# Patient Record
Sex: Female | Born: 1985 | Race: White | Hispanic: No | Marital: Married | Smoking: Never smoker
Health system: Southern US, Community
[De-identification: ages and names within clinical notes are randomized; demographics above are authoritative.]

## PROBLEM LIST (undated history)

## (undated) ENCOUNTER — Inpatient Hospital Stay (HOSPITAL_COMMUNITY): Payer: Self-pay

## (undated) DIAGNOSIS — E559 Vitamin D deficiency, unspecified: Secondary | ICD-10-CM

## (undated) HISTORY — PX: APPENDECTOMY: SHX54

## (undated) HISTORY — DX: Vitamin D deficiency, unspecified: E55.9

---

## 2016-05-13 ENCOUNTER — Ambulatory Visit (HOSPITAL_COMMUNITY)
Admission: EM | Admit: 2016-05-13 | Discharge: 2016-05-13 | Disposition: A | Discharge status: Home / Self Care | Payer: Self-pay | Attending: Family Medicine | Admitting: Family Medicine

## 2016-05-13 ENCOUNTER — Encounter (HOSPITAL_COMMUNITY): Payer: Self-pay | Admitting: Emergency Medicine

## 2016-05-13 DIAGNOSIS — Z9109 Other allergy status, other than to drugs and biological substances: Secondary | ICD-10-CM

## 2016-05-13 DIAGNOSIS — R220 Localized swelling, mass and lump, head: Secondary | ICD-10-CM

## 2016-05-13 DIAGNOSIS — T7840XA Allergy, unspecified, initial encounter: Secondary | ICD-10-CM

## 2016-05-13 DIAGNOSIS — Z91048 Other nonmedicinal substance allergy status: Secondary | ICD-10-CM

## 2016-05-13 MED ORDER — DIPHENHYDRAMINE HCL 25 MG PO CAPS: 25.0000 mg | ORAL_CAPSULE | Freq: Four times a day (QID) | ORAL | 0 refills | Status: DC | PRN

## 2016-05-13 MED ORDER — METHYLPREDNISOLONE 4 MG PO TBPK: ORAL_TABLET | ORAL | 0 refills | Status: DC

## 2016-05-13 NOTE — ED Notes (Signed)
Patient referred to ucc by [place] congregational nurse

## 2016-05-13 NOTE — ED Notes (Signed)
Patient denies pain and is resting comfortably.  

## 2016-05-13 NOTE — ED Triage Notes (Signed)
Patient has a swollen upper lip.  Reports this occurs once a week for two months now.  Lip is itchy.

## 2016-05-13 NOTE — ED Provider Notes (Signed)
CSN: [id number]     Arrival date & time 05/13/16  1103 History   None    No chief complaint on file.  (Consider location/radiation/quality/duration/timing/severity/associated sxs/prior Treatment) She has been having upper lip swelling on and off for last 2 months.  She is unaware of having any specific allergies.  She denies tongue swelling or swelling in her throat.  She did have some left eye peri- orbital swelling and eye itching but it has resolved.    Allergic Reaction  Presenting symptoms: itching and swelling   Severity:  Mild Duration:  2 days Prior allergic episodes:  Unable to specify Relieved by:  None tried Worsened by:  Nothing Ineffective treatments:  None tried   History reviewed. No pertinent past medical history. History reviewed. No pertinent surgical history. No family history on file. Social History  Substance Use Topics  . Smoking status: Never Smoker  . Smokeless tobacco: Never Used  . Alcohol use No   OB History    No data available     Review of Systems  Constitutional: Negative.   HENT: Positive for facial swelling.   Eyes: Negative.   Respiratory: Negative.   Cardiovascular: Negative.   Gastrointestinal: Negative.   Endocrine: Negative.   Genitourinary: Negative.   Musculoskeletal: Negative.   Skin: Positive for itching.  Allergic/Immunologic: Negative.   Neurological: Negative.   Hematological: Negative.   Psychiatric/Behavioral: Negative.     Allergies  Review of patient's allergies indicates not on file.  Home Medications   Prior to Admission medications   Not on File   Meds Ordered and Administered this Visit  Medications - No data to display  BP (!) 88/65 (BP Location: Left Arm)   Pulse 75   Temp 97.6 F (36.4 C) (Oral)   Resp 14   SpO2 97%  No data found.   Physical Exam  Constitutional: She appears well-developed and well-nourished.  HENT:  Head: Normocephalic and atraumatic.  Right Ear: External ear normal.   Left Ear: External ear normal.  Nose: Nose normal.  Mouth/Throat: Oropharynx is clear and moist.  Upper lip mildly swollen  Eyes: Conjunctivae and EOM are normal. Pupils are equal, round, and reactive to light.  Neck: Normal range of motion. Neck supple.  Cardiovascular: Normal rate, regular rhythm and normal heart sounds.   Pulmonary/Chest: Effort normal and breath sounds normal.  Abdominal: Soft. Bowel sounds are normal.  Nursing note and vitals reviewed.   Urgent Care Course   Clinical Course    Procedures (including critical care time)  Labs Review Labs Reviewed - No data to display  Imaging Review No results found.   Visual Acuity Review  Right Eye Distance:   Left Eye Distance:   Bilateral Distance:    Right Eye Near:   Left Eye Near:    Bilateral Near:         MDM  Allergies Upper lip swelling  Medrol dose pack as directed Diphenhydramine 25mg  one po q 8 hours prn #30  Follow up prn   Deatra CanterWilliam J Srikar Chiang, FNP 05/13/16 1441

## 2016-05-13 NOTE — Congregational Nurse Program (Signed)
Congregational Nurse Program Note  Date of Encounter: 05/13/2016  Past Medical History: No past medical history on file.  Encounter Details:     CNP Questionnaire - 05/13/16 1101      Patient Demographics   Is this a new or existing patient? New   Patient is considered a/an Immigrant   Race Asian     Patient Assistance   Location of Patient Assistance Not Applicable   Patient's financial/insurance status Self-Pay   Uninsured Patient Yes   Interventions Referred to ED/Urgent Care   Patient referred to apply for the following financial assistance [employer] Connects   Food insecurities addressed Not Applicable   Transportation assistance No   Assistance securing medications No   Educational Programmer, systemshealth offerings Navigating the healthcare system     Encounter Details   Primary purpose of visit Acute Illness/Condition Visit   Was an Emergency Department visit averted? Yes   Does patient have a medical provider? No   Patient referred to Urgent Care   Was a mental health screening completed? (GAINS tool) No   Does patient have dental issues? No   Does patient have vision issues? No   Does your patient have an abnormal blood pressure today? Yes   Since previous encounter, have you referred patient for abnormal blood pressure that resulted in a new diagnosis or medication change? No   Does your patient have an abnormal blood glucose today? No   Since previous encounter, have you referred patient for abnormal blood glucose that resulted in a new diagnosis or medication change? No   Was there a life-saving intervention made? No     Initial visit to office for this lady from [place] since one month. Arabic speaking, but communicated in [place] without Nurse, learning disabilitytranslator. Accompanied by family member with concern about swollen upper lip reoccurring about 2 months. Upper lip swollen outside and inside with blisters and itching.  Reddened rash of right wrist and anterior left thigh also. Problem  reocurring since 2 months and unrelated to any identified food or exposure to insects. Last food eaten within 24 hours included almonds. [facility]. Counseled on application for insurance coverage; referred to agency social worker. Return to nurse on 08-09. [facility] Leslie Liptak,RN/CN

## 2016-10-06 NOTE — L&D Delivery Note (Signed)
Delivery Note At 11:07 PM a viable female was delivered via VBAC, Spontaneous (Presentation:OA ).  APGAR: 9, 9; weight pending. Infant placed on maternal abdomen; stimulated. Cord clamped and cut after 1 min.  Placenta status: intact.  Cord: 1 UA.   Anesthesia:  epidural Episiotomy: None Lacerations:  1st degree perineal repair; hemostatic. Bilateral periurethral superficial tears, hemostatic.  Suture Repair: NA Est. Blood Loss (mL):  200  Mom to postpartum.  Baby to Couplet care / Skin to Skin.  Charlesetta Garibaldi Merric Yost CNM 05/23/2017, 11:24 PM

## 2016-11-12 NOTE — Congregational Nurse Program (Signed)
Congregational Nurse Program Note  Date of Encounter: 11/12/2016  Past Medical History: No past medical history on file.  Encounter Details:     CNP Questionnaire - 11/12/16 1045      Patient Demographics   Is this a new or existing patient? Existing   Patient is considered a/an Immigrant   Race Asian     Patient Assistance   Location of Patient Assistance Not Applicable   Patient's financial/insurance status Medicaid   Uninsured Patient ([employer]/Care Connects) No   Patient referred to apply for the following financial assistance Not Applicable   Food insecurities addressed Not Applicable   Transportation assistance No   Assistance securing medications No   Educational Programmer, systemshealth offerings Navigating the healthcare system     Encounter Details   Primary purpose of visit Navigating the Healthcare System   Was an Emergency Department visit averted? Not Applicable   Does patient have a medical provider? No   Patient referred to Other   Was a mental health screening completed? (GAINS tool) No   Does patient have dental issues? No   Does patient have vision issues? No   Does your patient have an abnormal blood pressure today? Yes   Since previous encounter, have you referred patient for abnormal blood pressure that resulted in a new diagnosis or medication change? No   Does your patient have an abnormal blood glucose today? No   Since previous encounter, have you referred patient for abnormal blood glucose that resulted in a new diagnosis or medication change? No   Was there a life-saving intervention made? No     Brief [facility] at [facility]  to request assistance in locating Obstetrical care. Speaks Arabic and limited [place]. Specified a female providerx due to Islamic beliefs and practices. Ineligible for Women's High Risk clinic and attempting to locate providers accepting Medicaid. [facility] at 8 weeks.  Expected  date of delivery 06-09-17. [facility], [address], [address] Appointment date pending; office will call client in 1 week for appointment at [phone]. Return prn. Ferol LuzMarietta Dwana Garin, RN/CN

## 2016-12-09 ENCOUNTER — Ambulatory Visit (INDEPENDENT_AMBULATORY_CARE_PROVIDER_SITE_OTHER): Payer: Medicaid Other | Admitting: Certified Nurse Midwife

## 2016-12-09 ENCOUNTER — Encounter: Payer: Self-pay | Admitting: Certified Nurse Midwife

## 2016-12-09 ENCOUNTER — Other Ambulatory Visit (HOSPITAL_COMMUNITY)
Admission: RE | Admit: 2016-12-09 | Discharge: 2016-12-09 | Disposition: A | Discharge status: Home / Self Care | Payer: Medicaid Other | Source: Ambulatory Visit | Attending: Certified Nurse Midwife | Admitting: Certified Nurse Midwife

## 2016-12-09 VITALS — BP 91/61 | HR 96 | Ht 64.0 in | Wt 112.6 lb

## 2016-12-09 DIAGNOSIS — Z3480 Encounter for supervision of other normal pregnancy, unspecified trimester: Secondary | ICD-10-CM | POA: Insufficient documentation

## 2016-12-09 DIAGNOSIS — Z3401 Encounter for supervision of normal first pregnancy, first trimester: Secondary | ICD-10-CM

## 2016-12-09 DIAGNOSIS — Z3689 Encounter for other specified antenatal screening: Secondary | ICD-10-CM

## 2016-12-09 DIAGNOSIS — B3731 Acute candidiasis of vulva and vagina: Secondary | ICD-10-CM

## 2016-12-09 DIAGNOSIS — O98811 Other maternal infectious and parasitic diseases complicating pregnancy, first trimester: Secondary | ICD-10-CM

## 2016-12-09 DIAGNOSIS — O34219 Maternal care for unspecified type scar from previous cesarean delivery: Secondary | ICD-10-CM

## 2016-12-09 DIAGNOSIS — B373 Candidiasis of vulva and vagina: Secondary | ICD-10-CM

## 2016-12-09 DIAGNOSIS — O219 Vomiting of pregnancy, unspecified: Secondary | ICD-10-CM

## 2016-12-09 DIAGNOSIS — Z3687 Encounter for antenatal screening for uncertain dates: Secondary | ICD-10-CM

## 2016-12-09 MED ORDER — PROVIDA DHA 16-16-1.25-110 MG PO CAPS: 1.0000 | ORAL_CAPSULE | Freq: Every day | ORAL | 12 refills | Status: DC

## 2016-12-09 MED ORDER — ONDANSETRON HCL 8 MG PO TABS: 8.0000 mg | ORAL_TABLET | Freq: Three times a day (TID) | ORAL | 2 refills | Status: DC | PRN

## 2016-12-09 MED ORDER — DOXYLAMINE-PYRIDOXINE 10-10 MG PO TBEC: DELAYED_RELEASE_TABLET | ORAL | 4 refills | Status: DC

## 2016-12-09 MED ORDER — TERCONAZOLE 0.8 % VA CREA
1.0000 | TOPICAL_CREAM | Freq: Every day | VAGINAL | 0 refills | Status: DC
Start: 2016-12-09 — End: 2017-01-07

## 2016-12-09 MED ORDER — FLUCONAZOLE 150 MG PO TABS: 150.0000 mg | ORAL_TABLET | Freq: Once | ORAL | 0 refills | Status: AC

## 2016-12-09 NOTE — Progress Notes (Signed)
Patient reports some lite cramps.Patient does have discharge.

## 2016-12-09 NOTE — Addendum Note (Signed)
Addended by: Samantha CrimesENNEY, RACHELLE ANNE on: 12/09/2016 05:49 PM   Modules accepted: Level of Service

## 2016-12-09 NOTE — Progress Notes (Signed)
Subjective:    Leslie Shaw is being seen today for her first obstetrical visit.  This is a planned pregnancy. She is at 1273w0d gestation. Her obstetrical history is significant for previous C-section for breech in [place] at 34 weeks PPROM, had previous vaginal delivery in [place]. Relationship with FOB: spouse, living together. Patient does intend to breast feed. Pregnancy history fully reviewed.  The information documented in the HPI was reviewed and verified.  Menstrual History: OB History    Gravida Para Term Preterm AB Living   3 2 1 1   2    SAB TAB Ectopic Multiple Live Births           2      Obstetric Comments   Patient had twin pregnancy- she had C-section delivery at 34 weeks- emergent delivery water broke- week 11 they knew of 1 twin demise       Patient's last menstrual period was 09/02/2016 (exact date).    History reviewed. No pertinent past medical history.  Past Surgical History:  Procedure Laterality Date  . APPENDECTOMY    . CESAREAN SECTION       (Not in a hospital admission) Allergies  Allergen Reactions  . Other Swelling    Tree nuts- pine cone    Social History  Substance Use Topics  . Smoking status: Never Smoker  . Smokeless tobacco: Never Used  . Alcohol use No    History reviewed. No pertinent family history.   Review of Systems Constitutional: negative for weight loss Gastrointestinal: + for vomiting Genitourinary:negative for genital lesions and vaginal discharge and dysuria Musculoskeletal:negative for back pain Behavioral/Psych: negative for abusive relationship, depression, illegal drug usage and tobacco use    Objective:    BP 91/61   Pulse 96   Ht 5\' 4"  (1.626 m)   Wt 112 lb 9.6 oz (51.1 kg)   LMP 09/02/2016 (Exact Date)   BMI 19.33 kg/m  General Appearance:    Alert, cooperative, no distress, appears stated age  Head:    Normocephalic, without obvious abnormality, atraumatic  Eyes:    PERRL, conjunctiva/corneas clear, EOM's  intact, fundi    benign, both eyes  Ears:    Normal TM's and external ear canals, both ears  Nose:   Nares normal, septum midline, mucosa normal, no drainage    or sinus tenderness  Throat:   Lips, mucosa, and tongue normal; teeth and gums normal  Neck:   Supple, symmetrical, trachea midline, no adenopathy;    thyroid:  no enlargement/tenderness/nodules; no carotid   bruit or JVD  Back:     Symmetric, no curvature, ROM normal, no CVA tenderness  Lungs:     Clear to auscultation bilaterally, respirations unlabored  Chest Wall:    No tenderness or deformity   Heart:    Regular rate and rhythm, S1 and S2 normal, no murmur, rub   or gallop  Breast Exam:    No tenderness, masses, or nipple abnormality  Abdomen:     Soft, non-tender, bowel sounds active all four quadrants,    no masses, no organomegaly  Genitalia:    Normal female without lesion, discharge or tenderness  Extremities:   Extremities normal, atraumatic, no cyanosis or edema  Pulses:   2+ and symmetric all extremities  Skin:   Skin color, texture, turgor normal, no rashes or lesions  Lymph nodes:   Cervical, supraclavicular, and axillary nodes normal  Neurologic:   CNII-XII intact, normal strength, sensation and reflexes  throughout        Cervix: long, thick, closed and posterior.  FHR: 158 by doppler.  FH: less than U.    Lab Review Urine pregnancy test Labs reviewed no Radiologic studies reviewed no Assessment:    Pregnancy at [redacted]w[redacted]d weeks     Supervision of other normal pregnancy, antepartum - Plan: Cervicovaginal ancillary only, TSH, Varicella zoster antibody, IgG, Hemoglobinopathy evaluation, Culture, OB Urine, MaterniT21 PLUS Core+SCA, Hemoglobin A1c, Obstetric Panel, Including HIV, Cystic Fibrosis Mutation 97, US OB Comp + 14 Wk, Prenat-FeFum-FePo-FA-DHA w/o A (PROVIDA DHA) 16-16-1.25-110 MG CAPS, Rheumatoid Arthritis Profile, US OB Comp Less 14 Wks, US OB Transvaginal, [place] MFM OB COMP + 14 WK, CANCELED: Cytology -  PAP  Unsure of LMP (last menstrual period) as reason for ultrasound scan - Plan: US OB Comp Less 14 Wks, US OB Transvaginal  Previous cesarean delivery, delivered  Nausea and vomiting in pregnancy prior to [redacted] weeks gestation - Plan: ondansetron (ZOFRAN) 8 MG tablet, Doxylamine-Pyridoxine (DICLEGIS) 10-10 MG TBEC  Yeast infection of the vagina - Plan: fluconazole (DIFLUCAN) 150 MG tablet, terconazole (TERAZOL 3) 0.8 % vaginal cream  Encounter for fetal anatomic survey - Plan: [place] MFM OB COMP + 14 WK   Plan:     Medical records requested from previous C-section.  Prenatal vitamins.  Counseling provided regarding continued use of seat belts, cessation of alcohol consumption, smoking or use of illicit drugs; infection precautions i.e., influenza/TDAP immunizations, toxoplasmosis,CMV, parvovirus, listeria and varicella; workplace safety, exercise during pregnancy; routine dental care, safe medications, sexual activity, hot tubs, saunas, pools, travel, caffeine use, fish and methlymercury, potential toxins, hair treatments, varicose veins Weight gain recommendations per IOM guidelines reviewed: underweight/BMI< 18.5--> gain 28 - 40 lbs; normal weight/BMI 18.5 - 24.9--> gain 25 - 35 lbs; overweight/BMI 25 - 29.9--> gain 15 - 25 lbs; obese/BMI >30->gain  11 - 20 lbs Problem list reviewed and updated. FIRST/CF mutation testing/NIPT/QUAD SCREEN/fragile X/Ashkenazi Jewish population testing/Spinal muscular atrophy discussed: ordered. Role of ultrasound in pregnancy discussed; fetal survey: ordered. Amniocentesis discussed: not indicated. VBAC calculator score: VBAC consent form provided Meds ordered this encounter  Medications  . prenatal vitamin w/FE, FA (PRENATAL 1 + 1) 27-1 MG TABS tablet    Sig: Take 1 tablet by mouth daily at 12 noon.  . pyridoxine (B-6) 100 MG tablet    Sig: Take 100 mg by mouth daily.  . colchicine 0.6 MG tablet    Sig: Take 0.6 mg by mouth daily. 1 mg  .  Prenat-FeFum-FePo-FA-DHA w/o A (PROVIDA DHA) 16-16-1.25-110 MG CAPS    Sig: Take 1 tablet by mouth at bedtime.    Dispense:  30 capsule    Refill:  12  . ondansetron (ZOFRAN) 8 MG tablet    Sig: Take 1 tablet (8 mg total) by mouth every 8 (eight) hours as needed for nausea or vomiting.    Dispense:  40 tablet    Refill:  2  . Doxylamine-Pyridoxine (DICLEGIS) 10-10 MG TBEC    Sig: Take 1 tablet with breakfast and lunch.  Take 2 tablets at bedtime.    Dispense:  100 tablet    Refill:  4  . fluconazole (DIFLUCAN) 150 MG tablet    Sig: Take 1 tablet (150 mg total) by mouth once.    Dispense:  1 tablet    Refill:  0  . terconazole (TERAZOL 3) 0.8 % vaginal cream    Sig: Place 1 applicator vaginally at bedtime.    Dispense:  20 g  Refill:  0   Orders Placed This Encounter  Procedures  . Culture, OB Urine  . US OB Comp + 14 Wk    Standing Status:   Future    Standing Expiration Date:   02/08/2018    Order Specific Question:   Reason for Exam (SYMPTOM  OR DIAGNOSIS REQUIRED)    Answer:   fetal anatomy scan    Order Specific Question:   Preferred imaging location?    Answer:   [facility]  . US OB Comp Less 14 Wks    Standing Status:   Future    Standing Expiration Date:   02/08/2018    Order Specific Question:   Reason for Exam (SYMPTOM  OR DIAGNOSIS REQUIRED)    Answer:   dating    Order Specific Question:   Preferred imaging location?    Answer:   [facility]  . US OB Transvaginal    Standing Status:   Future    Standing Expiration Date:   02/08/2018    Order Specific Question:   Reason for Exam (SYMPTOM  OR DIAGNOSIS REQUIRED)    Answer:   dating, hx of twins    Order Specific Question:   Preferred imaging location?    Answer:   [facility]  . [place] MFM OB COMP + 14 WK    Standing Status:   Future    Standing Expiration Date:   02/08/2018    Order Specific Question:   Reason for Exam (SYMPTOM  OR DIAGNOSIS REQUIRED)    Answer:   Needs anatomy scan    Order Specific  Question:   Preferred imaging location?    Answer:   [facility]  . TSH  . Varicella zoster antibody, IgG  . Hemoglobinopathy evaluation  . MaterniT21 PLUS Core+SCA    Order Specific Question:   Is the patient insulin dependent?    Answer:   No    Order Specific Question:   Please enter gestational age. This should be expressed as weeks AND days, i.e. 16w 6d. Enter weeks here. Enter days in next question.    Answer:   16    Order Specific Question:   Please enter gestational age. This should be expressed as weeks AND days, i.e. 16w 6d. Enter days here. Enter weeks in previous question.    Answer:   0    Order Specific Question:   How was gestational age calculated?    Answer:   LMP    Order Specific Question:   Please give the date of LMP OR Ultrasound OR Estimated date of delivery.    Answer:   06/09/2017    Order Specific Question:   Number of Fetuses (Type of Pregnancy):    Answer:   1    Order Specific Question:   Indications for performing the test? (please choose all that apply):    Answer:   Routine screening    Order Specific Question:   Other Indications? (Y=Yes, N=No)    Answer:   N    Order Specific Question:   If this is a repeat specimen, please indicate the reason:    Answer:   Not indicated    Order Specific Question:   Please specify the patient's race: (C=White/Caucasion, B=Black, I=Native American, A=Asian, H=Hispanic, O=Other, U=Unknown)    Answer:   O    Order Specific Question:   Donor Egg - indicate if the egg was obtained from in vitro fertilization.    Answer:   N  Order Specific Question:   Age of Egg Donor.    Answer:   16    Order Specific Question:   Prior Down Syndrome/ONTD screening during current pregnancy.    Answer:   N    Order Specific Question:   Prior First Trimester Testing    Answer:   N    Order Specific Question:   Prior Second Trimester Testing    Answer:   N    Order Specific Question:   Family History of Neural Tube Defects     Answer:   N    Order Specific Question:   Prior Pregnancy with Down Syndrome    Answer:   N    Order Specific Question:   Please give the patient's weight (in pounds)    Answer:   113  . Hemoglobin A1c  . Obstetric Panel, Including HIV  . Cystic Fibrosis Mutation 97  . Rheumatoid Arthritis Profile    Follow up in 4 weeks. 50% of 30 min visit spent on counseling and coordination of care.

## 2016-12-10 ENCOUNTER — Other Ambulatory Visit: Payer: Self-pay | Admitting: Certified Nurse Midwife

## 2016-12-10 DIAGNOSIS — A749 Chlamydial infection, unspecified: Secondary | ICD-10-CM | POA: Insufficient documentation

## 2016-12-10 DIAGNOSIS — O98812 Other maternal infectious and parasitic diseases complicating pregnancy, second trimester: Principal | ICD-10-CM

## 2016-12-10 LAB — CERVICOVAGINAL ANCILLARY ONLY
Bacterial vaginitis: NEGATIVE
Candida vaginitis: NEGATIVE
Chlamydia: POSITIVE — AB
NEISSERIA GONORRHEA: NEGATIVE
Trichomonas: NEGATIVE

## 2016-12-10 MED ORDER — AZITHROMYCIN 250 MG PO TABS: ORAL_TABLET | ORAL | 0 refills | Status: DC

## 2016-12-11 ENCOUNTER — Telehealth: Payer: Self-pay

## 2016-12-11 NOTE — Telephone Encounter (Signed)
Patient called in, advised of results, treatment and follow up testing, patient agreed.

## 2016-12-11 NOTE — Telephone Encounter (Signed)
Attempted to contact about results, no answer, left vm 

## 2016-12-12 LAB — CULTURE, OB URINE

## 2016-12-12 LAB — URINE CULTURE, OB REFLEX

## 2016-12-16 ENCOUNTER — Encounter (HOSPITAL_COMMUNITY): Payer: Self-pay

## 2016-12-16 ENCOUNTER — Ambulatory Visit (HOSPITAL_COMMUNITY)
Admission: RE | Admit: 2016-12-16 | Discharge: 2016-12-16 | Disposition: A | Discharge status: Home / Self Care | Payer: Medicaid Other | Source: Ambulatory Visit | Attending: Certified Nurse Midwife | Admitting: Certified Nurse Midwife

## 2016-12-16 ENCOUNTER — Other Ambulatory Visit: Payer: Self-pay | Admitting: Certified Nurse Midwife

## 2016-12-16 DIAGNOSIS — Z3687 Encounter for antenatal screening for uncertain dates: Secondary | ICD-10-CM

## 2016-12-16 DIAGNOSIS — Z3480 Encounter for supervision of other normal pregnancy, unspecified trimester: Secondary | ICD-10-CM

## 2016-12-16 DIAGNOSIS — O09212 Supervision of pregnancy with history of pre-term labor, second trimester: Secondary | ICD-10-CM | POA: Diagnosis not present

## 2016-12-16 DIAGNOSIS — Z3492 Encounter for supervision of normal pregnancy, unspecified, second trimester: Secondary | ICD-10-CM

## 2016-12-16 DIAGNOSIS — Z3689 Encounter for other specified antenatal screening: Secondary | ICD-10-CM | POA: Insufficient documentation

## 2016-12-16 DIAGNOSIS — O34211 Maternal care for low transverse scar from previous cesarean delivery: Secondary | ICD-10-CM | POA: Diagnosis not present

## 2016-12-16 DIAGNOSIS — Z3A15 15 weeks gestation of pregnancy: Secondary | ICD-10-CM | POA: Insufficient documentation

## 2016-12-16 LAB — OBSTETRIC PANEL, INCLUDING HIV
ANTIBODY SCREEN: NEGATIVE
BASOS ABS: 0 10*3/uL (ref 0.0–0.2)
BASOS: 0 %
EOS (ABSOLUTE): 0 10*3/uL (ref 0.0–0.4)
Eos: 0 %
HEMATOCRIT: 38.3 % (ref 34.0–46.6)
HIV SCREEN 4TH GENERATION: NONREACTIVE
Hemoglobin: 13.3 g/dL (ref 11.1–15.9)
Hepatitis B Surface Ag: NEGATIVE
Immature Grans (Abs): 0 10*3/uL (ref 0.0–0.1)
Immature Granulocytes: 0 %
Lymphocytes Absolute: 2 10*3/uL (ref 0.7–3.1)
Lymphs: 25 %
MCH: 29.4 pg (ref 26.6–33.0)
MCHC: 34.7 g/dL (ref 31.5–35.7)
MCV: 85 fL (ref 79–97)
MONOCYTES: 3 %
MONOS ABS: 0.3 10*3/uL (ref 0.1–0.9)
NEUTROS ABS: 5.8 10*3/uL (ref 1.4–7.0)
Neutrophils: 72 %
PLATELETS: 199 10*3/uL (ref 150–379)
RBC: 4.53 x10E6/uL (ref 3.77–5.28)
RDW: 14.3 % (ref 12.3–15.4)
RPR Ser Ql: NONREACTIVE
Rh Factor: POSITIVE
Rubella Antibodies, IGG: 27.1 index (ref >0.99)
WBC: 8.1 10*3/uL (ref 3.4–10.8)

## 2016-12-16 LAB — MATERNIT21 PLUS CORE+SCA
CHROMOSOME 13: NEGATIVE
Chromosome 18: NEGATIVE
Chromosome 21: NEGATIVE
Y Chromosome: DETECTED

## 2016-12-16 LAB — HEMOGLOBIN A1C
ESTIMATED AVERAGE GLUCOSE: 94 mg/dL
HEMOGLOBIN A1C: 4.9 % (ref 4.8–5.6)

## 2016-12-16 LAB — HEMOGLOBINOPATHY EVALUATION
HEMOGLOBIN A2 QUANTITATION: 2.2 % (ref 1.8–3.2)
HEMOGLOBIN F QUANTITATION: 0 % (ref 0.0–2.0)
HGB A: 97.8 % (ref 96.4–98.8)
HGB C: 0 %
HGB S: 0 %
HGB VARIANT: 0 %

## 2016-12-16 LAB — RHEUMATOID ARTHRITIS PROFILE
Cyclic Citrullin Peptide Ab: 3 units (ref 0–19)
Rhuematoid fact SerPl-aCnc: <10 IU/mL (ref 0.0–13.9)

## 2016-12-16 LAB — VARICELLA ZOSTER ANTIBODY, IGG: VARICELLA: 192 {index} (ref >165)

## 2016-12-16 LAB — CYSTIC FIBROSIS MUTATION 97: GENE DIS ANAL CARRIER INTERP BLD/T-IMP: NOT DETECTED

## 2016-12-16 LAB — TSH: TSH: 2.67 u[IU]/mL (ref 0.450–4.500)

## 2016-12-19 LAB — OB RESULTS CONSOLE GC/CHLAMYDIA
CHLAMYDIA, DNA PROBE: POSITIVE
GC PROBE AMP, GENITAL: NEGATIVE

## 2016-12-29 ENCOUNTER — Other Ambulatory Visit: Payer: Self-pay | Admitting: Certified Nurse Midwife

## 2016-12-29 NOTE — Progress Notes (Signed)
Spoke with patient regarding positive results.  Patient is confused on how she contracted Chlamydia.  Discussed that we do not know, we just know that she tested positive.  5/10 men have no symptoms, states that her [facility].  She has never been with another man besides her husband.  Encouraged patient to come do a self swab to retest for the Chlamydia.  Transferred to scheduling.  Plan discussed with Triage RN. R.Denney CNM

## 2017-01-06 ENCOUNTER — Encounter: Payer: Medicaid Other | Admitting: Certified Nurse Midwife

## 2017-01-07 ENCOUNTER — Encounter: Payer: Self-pay | Admitting: Certified Nurse Midwife

## 2017-01-07 ENCOUNTER — Ambulatory Visit (INDEPENDENT_AMBULATORY_CARE_PROVIDER_SITE_OTHER): Payer: Medicaid Other | Admitting: Certified Nurse Midwife

## 2017-01-07 VITALS — BP 103/69 | HR 106 | Wt 119.8 lb

## 2017-01-07 DIAGNOSIS — O34219 Maternal care for unspecified type scar from previous cesarean delivery: Secondary | ICD-10-CM

## 2017-01-07 DIAGNOSIS — Z3480 Encounter for supervision of other normal pregnancy, unspecified trimester: Secondary | ICD-10-CM

## 2017-01-07 MED ORDER — NATACHEW 28-1 MG PO CHEW: 1.0000 | CHEWABLE_TABLET | Freq: Every day | ORAL | 12 refills | Status: DC

## 2017-01-07 NOTE — Progress Notes (Signed)
Patient has questions about her results. °

## 2017-01-07 NOTE — Progress Notes (Signed)
   PRENATAL VISIT NOTE  Subjective:  Leslie Shaw is a 31 y.o. G3P1102 at 1 [redacted]w[redacted]d being seen today for ongoing prenatal care.  She is currently monitored for the following issues for this low-risk pregnancy and has Supervision of other normal pregnancy, antepartum; Previous cesarean delivery, delivered; and Chlamydia infection affecting pregnancy in second trimester on her problem list.  Patient reports no complaints.  Contractions: Irritability. Vag. Bleeding: None.  Movement: Present. Denies leaking of fluid.   The following portions of the patient's history were reviewed and updated as appropriate: allergies, current medications, past family history, past medical history, past social history, past surgical history and problem list. Problem list updated.  Objective:   Vitals:   01/07/17 1455  BP: 103/69  Pulse: (!) 106  Weight: 119 lb 12.8 oz (54.3 kg)    Fetal Status: Fetal Heart Rate (bpm): 155   Movement: Present     General:  Alert, oriented and cooperative. Patient is in no acute distress.  Skin: Skin is warm and dry. No rash noted.   Cardiovascular: Normal heart rate noted  Respiratory: Normal respiratory effort, no problems with respiration noted  Abdomen: Soft, gravid, appropriate for gestational age. Pain/Pressure: Absent     Pelvic:  Cervical exam deferred        Extremities: Normal range of motion.  Edema: None  Mental Status: Normal mood and affect. Normal behavior. Normal judgment and thought content.   Assessment and Plan:  Pregnancy: G3P1102 at [redacted]w[redacted]d  1. Supervision of other normal pregnancy, antepartum     Doing well - Prenatal Vit-Fe Fum-Fe Bisg-FA (NATACHEW) 28-1 MG CHEW; Chew 1 tablet by mouth daily.  Dispense: 30 tablet; Refill: 12  2. Previous cesarean delivery, delivered       Preterm labor symptoms and general obstetric precautions including but not limited to vaginal bleeding, contractions, leaking of fluid and fetal movement were reviewed in detail  with the patient. Please refer to After Visit Summary for other counseling recommendations.  Return in about 4 weeks (around 02/04/2017) for ROB.   Roe Coombs, CNM

## 2017-01-08 ENCOUNTER — Telehealth: Payer: Self-pay | Admitting: *Deleted

## 2017-01-08 NOTE — Telephone Encounter (Signed)
Attempt to contact pt. LM on VM to call office.  Pt needs to be scheduled for lab visit for AFP and also need to have GC/CH on urine sample.

## 2017-01-09 ENCOUNTER — Other Ambulatory Visit: Payer: Self-pay | Admitting: Obstetrics & Gynecology

## 2017-01-09 ENCOUNTER — Other Ambulatory Visit (HOSPITAL_COMMUNITY): Payer: Self-pay | Admitting: *Deleted

## 2017-01-09 ENCOUNTER — Other Ambulatory Visit (HOSPITAL_COMMUNITY)
Admission: RE | Admit: 2017-01-09 | Discharge: 2017-01-09 | Disposition: A | Discharge status: Home / Self Care | Payer: Medicaid Other | Source: Ambulatory Visit | Attending: Certified Nurse Midwife | Admitting: Certified Nurse Midwife

## 2017-01-09 ENCOUNTER — Ambulatory Visit (HOSPITAL_COMMUNITY)
Admission: RE | Admit: 2017-01-09 | Discharge: 2017-01-09 | Disposition: A | Discharge status: Home / Self Care | Payer: Medicaid Other | Source: Ambulatory Visit | Attending: Obstetrics & Gynecology | Admitting: Obstetrics & Gynecology

## 2017-01-09 ENCOUNTER — Encounter: Payer: Self-pay | Admitting: Obstetrics & Gynecology

## 2017-01-09 ENCOUNTER — Other Ambulatory Visit: Payer: Medicaid Other

## 2017-01-09 ENCOUNTER — Ambulatory Visit (HOSPITAL_COMMUNITY): Payer: Medicaid Other

## 2017-01-09 DIAGNOSIS — Z113 Encounter for screening for infections with a predominantly sexual mode of transmission: Secondary | ICD-10-CM | POA: Insufficient documentation

## 2017-01-09 DIAGNOSIS — Z3480 Encounter for supervision of other normal pregnancy, unspecified trimester: Secondary | ICD-10-CM

## 2017-01-09 DIAGNOSIS — Z3689 Encounter for other specified antenatal screening: Secondary | ICD-10-CM | POA: Diagnosis present

## 2017-01-09 DIAGNOSIS — O09819 Supervision of pregnancy resulting from assisted reproductive technology, unspecified trimester: Secondary | ICD-10-CM

## 2017-01-09 DIAGNOSIS — O09899 Supervision of other high risk pregnancies, unspecified trimester: Secondary | ICD-10-CM | POA: Insufficient documentation

## 2017-01-09 DIAGNOSIS — Z3A18 18 weeks gestation of pregnancy: Secondary | ICD-10-CM | POA: Diagnosis not present

## 2017-01-09 DIAGNOSIS — O350XX0 Maternal care for (suspected) central nervous system malformation in fetus, not applicable or unspecified: Secondary | ICD-10-CM | POA: Insufficient documentation

## 2017-01-09 DIAGNOSIS — O3503X0 Maternal care for (suspected) central nervous system malformation or damage in fetus, choroid plexus cysts, not applicable or unspecified: Secondary | ICD-10-CM | POA: Insufficient documentation

## 2017-01-09 DIAGNOSIS — Q270 Congenital absence and hypoplasia of umbilical artery: Secondary | ICD-10-CM

## 2017-01-12 LAB — GC/CHLAMYDIA PROBE AMP (~~LOC~~) NOT AT ARMC
Chlamydia: NEGATIVE
Neisseria Gonorrhea: NEGATIVE

## 2017-01-13 ENCOUNTER — Telehealth: Payer: Self-pay | Admitting: *Deleted

## 2017-01-13 NOTE — Telephone Encounter (Signed)
Pt called to office for lab results. AFP not yet resulted, will check back for results.

## 2017-01-14 ENCOUNTER — Telehealth: Payer: Self-pay | Admitting: *Deleted

## 2017-01-14 ENCOUNTER — Other Ambulatory Visit: Payer: Self-pay | Admitting: Certified Nurse Midwife

## 2017-01-14 DIAGNOSIS — Z3480 Encounter for supervision of other normal pregnancy, unspecified trimester: Secondary | ICD-10-CM

## 2017-01-14 LAB — AFP, SERUM, OPEN SPINA BIFIDA
AFP MOM: 1.66
AFP Value: 86.4 ng/mL
Gest. Age on Collection Date: 18.3 weeks
MATERNAL AGE AT EDD: 33.3 a
OSBR Risk 1 IN: 1802
TEST RESULTS AFP: NEGATIVE
Weight: 119 [lb_av]

## 2017-01-14 NOTE — Telephone Encounter (Signed)
Pt called to office for lab results.  LM on VM making pt aware of results.

## 2017-02-04 ENCOUNTER — Ambulatory Visit (INDEPENDENT_AMBULATORY_CARE_PROVIDER_SITE_OTHER): Payer: Medicaid Other | Admitting: Certified Nurse Midwife

## 2017-02-04 ENCOUNTER — Encounter: Payer: Self-pay | Admitting: Certified Nurse Midwife

## 2017-02-04 VITALS — BP 93/61 | HR 88 | Wt 123.0 lb

## 2017-02-04 DIAGNOSIS — O219 Vomiting of pregnancy, unspecified: Secondary | ICD-10-CM

## 2017-02-04 DIAGNOSIS — O09892 Supervision of other high risk pregnancies, second trimester: Secondary | ICD-10-CM

## 2017-02-04 DIAGNOSIS — O09899 Supervision of other high risk pregnancies, unspecified trimester: Secondary | ICD-10-CM

## 2017-02-04 DIAGNOSIS — Z3482 Encounter for supervision of other normal pregnancy, second trimester: Secondary | ICD-10-CM

## 2017-02-04 DIAGNOSIS — Z3480 Encounter for supervision of other normal pregnancy, unspecified trimester: Secondary | ICD-10-CM

## 2017-02-04 MED ORDER — ONDANSETRON HCL 8 MG PO TABS: 8.0000 mg | ORAL_TABLET | Freq: Three times a day (TID) | ORAL | 2 refills | Status: DC | PRN

## 2017-02-04 MED ORDER — DOXYLAMINE-PYRIDOXINE 10-10 MG PO TBEC: DELAYED_RELEASE_TABLET | ORAL | 4 refills | Status: DC

## 2017-02-04 NOTE — Patient Instructions (Signed)
AREA PEDIATRIC/FAMILY PRACTICE PHYSICIANS  [facility] [address], [address] [place], [place]  [address] Phone - [phone]   Fax - [phone]  [facility] [address] [place], [place] [address] Phone - [phone]   Fax - [phone]  [facility] [address] [place], [place]  [address] Phone - [phone]   Fax - [phone]  [facility] [address] [place], [place]  [address] Phone - [phone]   Fax - [phone]  [facility] [address] [place], [place]  [address] Phone - [phone]   Fax - [phone]  [facility] [address], [address] [place], [place]  [address] Phone - [phone]   Fax - [phone]  [facility] [address] [place], [place]  [address] Phone - [phone]   Fax - [phone]  [facility] [address], [address] [place], [place]  [address] Phone - [phone]   Fax - [phone]  [facility] [address] [place], [place]  [address] Phone - [phone]   Fax - [phone] [facility] [address] [place], [place]  [address] Phone - [phone]   Fax - [phone]  [facility] [address] [place], [place]  [address] Phone - [phone]   Fax - [phone]  [facility] [address] [place], [place]  [address] Phone - [phone]   Fax - [phone]  [facility] [address], [address] [place], [place]  [address] Phone - [phone]   Fax - [phone]  SHILPA GOSRANI [address] [place], [place]  [address] Phone - [phone]  [facility] [address] [place], [place]  [address] Phone - [phone]   Fax - [phone]  [facility] [address] [place], [place]  [address] Phone - [phone]   Fax - [phone]  [facility] [address] [place], [place]  [address] Phone - [phone]   Fax - [phone]  [facility] [address] [place], [place]  [address] Phone - [phone]   Fax - [phone]   [facility] [address] [place], [place]  [address] Phone - [phone]   Fax - [phone]  [facility] [address] [place], [place]  [address] Phone - [phone]   Fax - [phone]  DAV[facility] RUB[facility] [address] [place], [place]  [address] Phone - [phone]   Fax - [phone]  [facility] [address] [place], [place]  [address] Phone - [phone]   Fax - [phone]  [facility] [address] [place], [place]  [address] Phone - [phone]   Fax - [phone] [facility] [address] [place], [place]  [address] Phone - [phone]   Fax - [phone]  [facility] [address] [place], [place]  [address] Phone - [phone]   Fax - [phone]  [facility] [address] [place],   [address] Phone - [phone]   Fax - [phone]  [facility] Charlene Flemming MD [address] [place]  [address] Phone [phone]  Fax [phone]   

## 2017-02-04 NOTE — Progress Notes (Signed)
   PRENATAL VISIT NOTE  Subjective:  Leslie Shaw is a 31 y.o. G3P1102 at [redacted]w[redacted]d being seen today for ongoing prenatal care.  She is currently monitored for the following issues for this low-risk pregnancy and has Supervision of other normal pregnancy, antepartum; Previous cesarean delivery, delivered; Chlamydia infection affecting pregnancy in second trimester; Choroid plexus cyst of fetus, antepartum; and Single umbilical artery, maternal, antepartum on her problem list.  Patient reports no complaints.  Contractions: Not present. Vag. Bleeding: None.  Movement: Present. Denies leaking of fluid.   The following portions of the patient's history were reviewed and updated as appropriate: allergies, current medications, past family history, past medical history, past social history, past surgical history and problem list. Problem list updated.  Objective:   Vitals:   02/04/17 1343  BP: 93/61  Pulse: 88  Weight: 123 lb (55.8 kg)    Fetal Status: Fetal Heart Rate (bpm): 148 Fundal Height: 22 cm Movement: Present     General:  Alert, oriented and cooperative. Patient is in no acute distress.  Skin: Skin is warm and dry. No rash noted.   Cardiovascular: Normal heart rate noted  Respiratory: Normal respiratory effort, no problems with respiration noted  Abdomen: Soft, gravid, appropriate for gestational age. Pain/Pressure: Absent     Pelvic:  Cervical exam deferred        Extremities: Normal range of motion.     Mental Status: Normal mood and affect. Normal behavior. Normal judgment and thought content.   Assessment and Plan:  Pregnancy: G3P1102 at [redacted]w[redacted]d  1. Supervision of other normal pregnancy, antepartum     Doing well  2. Single umbilical artery, maternal, antepartum     F/U US for growth scheduled  3. Nausea and vomiting in pregnancy at [redacted] weeks gestation     - Doxylamine-Pyridoxine (DICLEGIS) 10-10 MG TBEC; Take 1 tablet with breakfast and lunch.  Take 2 tablets at bedtime.   Dispense: 100 tablet; Refill: 4 - ondansetron (ZOFRAN) 8 MG tablet; Take 1 tablet (8 mg total) by mouth every 8 (eight) hours as needed for nausea or vomiting.  Dispense: 40 tablet; Refill: 2  Preterm labor symptoms and general obstetric precautions including but not limited to vaginal bleeding, contractions, leaking of fluid and fetal movement were reviewed in detail with the patient. Please refer to After Visit Summary for other counseling recommendations.  Return in about 4 weeks (around 03/04/2017) for ROB.   Roe Coombs, CNM

## 2017-02-04 NOTE — Progress Notes (Signed)
Pt states she is having some abd itching. Pt would like to discuss u/s - need results.

## 2017-02-27 ENCOUNTER — Inpatient Hospital Stay (HOSPITAL_COMMUNITY)
Admission: AD | Admit: 2017-02-27 | Discharge: 2017-02-27 | Disposition: A | Discharge status: Home / Self Care | Payer: Medicaid Other | Source: Ambulatory Visit | Attending: Obstetrics & Gynecology | Admitting: Obstetrics & Gynecology

## 2017-02-27 ENCOUNTER — Encounter (HOSPITAL_COMMUNITY): Payer: Self-pay

## 2017-02-27 DIAGNOSIS — R05 Cough: Secondary | ICD-10-CM | POA: Diagnosis present

## 2017-02-27 DIAGNOSIS — Z3A25 25 weeks gestation of pregnancy: Secondary | ICD-10-CM | POA: Diagnosis not present

## 2017-02-27 DIAGNOSIS — R0981 Nasal congestion: Secondary | ICD-10-CM | POA: Diagnosis present

## 2017-02-27 DIAGNOSIS — O9989 Other specified diseases and conditions complicating pregnancy, childbirth and the puerperium: Secondary | ICD-10-CM | POA: Diagnosis not present

## 2017-02-27 DIAGNOSIS — J00 Acute nasopharyngitis [common cold]: Secondary | ICD-10-CM | POA: Diagnosis not present

## 2017-02-27 DIAGNOSIS — O09212 Supervision of pregnancy with history of pre-term labor, second trimester: Secondary | ICD-10-CM | POA: Insufficient documentation

## 2017-02-27 DIAGNOSIS — R51 Headache: Secondary | ICD-10-CM | POA: Insufficient documentation

## 2017-02-27 LAB — URINALYSIS, ROUTINE W REFLEX MICROSCOPIC
Bilirubin Urine: NEGATIVE
GLUCOSE, UA: NEGATIVE mg/dL
Hgb urine dipstick: NEGATIVE
KETONES UR: NEGATIVE mg/dL
LEUKOCYTES UA: NEGATIVE
NITRITE: NEGATIVE
PH: 7 (ref 5.0–8.0)
Protein, ur: NEGATIVE mg/dL
SPECIFIC GRAVITY, URINE: 1.006 (ref 1.005–1.030)

## 2017-02-27 NOTE — MAU Provider Note (Signed)
Patient Leslie Shaw is a 31 y.o. [id number] At 8946w3d here with complaints of muscle pains and throat pain for the past three days. Patient and husband both speak [place].   Patient currently fasts during the day during Ramadan. Ramadan ends on 03-20-2017.  History     CSN: [id number]  Arrival date and time: 02/27/17 1232   First Provider Initiated Contact with Patient 02/27/17 1315      No chief complaint on file.  URI   This is a new problem. The current episode started in the past 7 days. The problem has been gradually worsening. There has been no fever. Associated symptoms include congestion, coughing and headaches. Pertinent negatives include no diarrhea, dysuria, nausea, sneezing or vomiting. She has tried nothing for the symptoms.    OB History    Gravida Para Term Preterm AB Living   3 2 1 1   2    SAB TAB Ectopic Multiple Live Births           2      Obstetric Comments   Patient had twin pregnancy- she had C-section delivery at 34 weeks- emergent delivery water broke- week 11 they knew of 1 twin demise      History reviewed. No pertinent past medical history.  Past Surgical History:  Procedure Laterality Date  . APPENDECTOMY    . CESAREAN SECTION      History reviewed. No pertinent family history.  Social History  Substance Use Topics  . Smoking status: Never Smoker  . Smokeless tobacco: Never Used  . Alcohol use No    Allergies:  Allergies  Allergen Reactions  . Other Swelling    Tree nuts- pine cone  . Pistachio Nut (Diagnostic) Swelling    Prescriptions Prior to Admission  Medication Sig Dispense Refill Last Dose  . Doxylamine-Pyridoxine (DICLEGIS) 10-10 MG TBEC Take 1 tablet with breakfast and lunch.  Take 2 tablets at bedtime. 100 tablet 4   . ondansetron (ZOFRAN) 8 MG tablet Take 1 tablet (8 mg total) by mouth every 8 (eight) hours as needed for nausea or vomiting. 40 tablet 2   . Prenatal Vit-Fe Fum-Fe Bisg-FA (NATACHEW) 28-1 MG CHEW Chew 1 tablet  by mouth daily. 30 tablet 12     Review of Systems  HENT: Positive for congestion. Negative for sneezing.   Respiratory: Positive for cough.   Gastrointestinal: Negative for diarrhea, nausea and vomiting.  Genitourinary: Negative for dysuria.  Musculoskeletal: Positive for myalgias.       Says that her legs get cramps when she walks  Neurological: Positive for headaches.  Psychiatric/Behavioral: Negative.    Physical Exam   Blood pressure 107/67, pulse (!) 101, temperature 97.6 F (36.4 C), temperature source Oral, resp. rate 16, weight 121 lb 1.9 oz (54.9 kg), last menstrual period 09/02/2016, SpO2 97 %.  Physical Exam  Constitutional: She is oriented to person, place, and time. She appears well-developed and well-nourished.  HENT:  Head: Normocephalic.  Right Ear: External ear normal.  Left Ear: External ear normal.  Mouth/Throat: Oropharynx is clear and moist. No oropharyngeal exudate.  Pearly gray tympanic membrane, no erythema or tragal tenderness. Nares pink and moist; throat pale pink with no exudate. No lymphadenopathy.   Neck: Normal range of motion.  Cardiovascular: Normal rate and regular rhythm.   Respiratory: Effort normal. No stridor. No respiratory distress. She has no wheezes. She has no rales. She exhibits no tenderness.  GI: Soft.  Musculoskeletal: Normal range of motion.  Lymphadenopathy:  She has no cervical adenopathy.  Neurological: She is alert and oriented to person, place, and time. She has normal reflexes.  Skin: Skin is warm and dry.    MAU Course  Procedures  MDM -physical exam benign; lungs CTAB, no evidence of pneumonia, flu or strep (no tachycardia, no fever) -NST: 150 bpm, present acel, no decel, no contractions.   Assessment and Plan   1. Acute nasopharyngitis    2. Patient stable for discharge with list of comfort measures given.  3. Reviewed when to return to the MAU (bleeding, leaking of fluid)  4. Discussed with patient and  husband the importance of hydration and regular meals every day for pregnant women. Advised patient to consider breaking Ramadan fast in order to make sure she has enough energy for herself and for her growing fetus, as well as decreasing the risk that she will return to the MAU for contractions or further illness.  Husband and patient verbalized understanding.  Charlesetta Garibaldi Jurgen Groeneveld 02/27/2017, 1:19 PM

## 2017-02-27 NOTE — Discharge Instructions (Signed)
Upper Respiratory Infection, Adult Most upper respiratory infections (URIs) are caused by a virus. A URI affects the nose, throat, and upper air passages. The most common type of URI is often called "the common cold." Follow these instructions at home:  Take medicines only as told by your doctor.  Gargle warm saltwater or take cough drops to comfort your throat as told by your doctor.  Use a warm mist humidifier or inhale steam from a shower to increase air moisture. This may make it easier to breathe.  Drink enough fluid to keep your pee (urine) clear or pale yellow.  Eat soups and other clear broths.  Have a healthy diet.  Rest as needed.  Go back to work when your fever is gone or your doctor says it is okay.  You may need to stay home longer to avoid giving your URI to others.  You can also wear a face mask and wash your hands often to prevent spread of the virus.  Use your inhaler more if you have asthma.  Do not use any tobacco products, including cigarettes, chewing tobacco, or electronic cigarettes. If you need help quitting, ask your doctor. Contact a doctor if:  You are getting worse, not better.  Your symptoms are not helped by medicine.  You have chills.  You are getting more short of breath.  You have brown or red mucus.  You have yellow or brown discharge from your nose.  You have pain in your face, especially when you bend forward.  You have a fever.  You have puffy (swollen) neck glands.  You have pain while swallowing.  You have white areas in the back of your throat. Get help right away if:  You have very bad or constant:  Headache.  Ear pain.  Pain in your forehead, behind your eyes, and over your cheekbones (sinus pain).  Chest pain.  You have long-lasting (chronic) lung disease and any of the following:  Wheezing.  Long-lasting cough.  Coughing up blood.  A change in your usual mucus.  You have a stiff neck.  You have  changes in your:  Vision.  Hearing.  Thinking.  Mood. This information is not intended to replace advice given to you by your health care provider. Make sure you discuss any questions you have with your health care provider. Document Released: 03/10/2008 Document Revised: 05/25/2016 Document Reviewed: 12/28/2013 Elsevier Interactive Patient Education  2017 Elsevier Inc.  

## 2017-02-27 NOTE — MAU Note (Signed)
+  sore throat x 3 days  +congestion +cough Denies fever or chills  +headache  +bilateral knee pain--states feels pain when she tries to walk; discomfort  Rating pain a 9/10  +fatigue

## 2017-03-06 ENCOUNTER — Encounter (HOSPITAL_COMMUNITY): Payer: Self-pay

## 2017-03-06 ENCOUNTER — Other Ambulatory Visit (HOSPITAL_COMMUNITY): Payer: Self-pay | Admitting: Maternal and Fetal Medicine

## 2017-03-06 ENCOUNTER — Other Ambulatory Visit (HOSPITAL_COMMUNITY): Payer: Self-pay | Admitting: *Deleted

## 2017-03-06 ENCOUNTER — Ambulatory Visit (HOSPITAL_COMMUNITY)
Admission: RE | Admit: 2017-03-06 | Discharge: 2017-03-06 | Disposition: A | Discharge status: Home / Self Care | Payer: Medicaid Other | Source: Ambulatory Visit | Attending: Obstetrics & Gynecology | Admitting: Obstetrics & Gynecology

## 2017-03-06 DIAGNOSIS — O09212 Supervision of pregnancy with history of pre-term labor, second trimester: Secondary | ICD-10-CM

## 2017-03-06 DIAGNOSIS — Z362 Encounter for other antenatal screening follow-up: Secondary | ICD-10-CM | POA: Insufficient documentation

## 2017-03-06 DIAGNOSIS — Z98891 History of uterine scar from previous surgery: Secondary | ICD-10-CM

## 2017-03-06 DIAGNOSIS — O09899 Supervision of other high risk pregnancies, unspecified trimester: Secondary | ICD-10-CM

## 2017-03-06 DIAGNOSIS — Z3A26 26 weeks gestation of pregnancy: Secondary | ICD-10-CM | POA: Diagnosis not present

## 2017-03-06 DIAGNOSIS — O34219 Maternal care for unspecified type scar from previous cesarean delivery: Secondary | ICD-10-CM | POA: Diagnosis not present

## 2017-03-06 DIAGNOSIS — Q270 Congenital absence and hypoplasia of umbilical artery: Secondary | ICD-10-CM

## 2017-03-10 ENCOUNTER — Encounter: Payer: Self-pay | Admitting: Certified Nurse Midwife

## 2017-03-10 ENCOUNTER — Ambulatory Visit (INDEPENDENT_AMBULATORY_CARE_PROVIDER_SITE_OTHER): Payer: Medicaid Other | Admitting: Certified Nurse Midwife

## 2017-03-10 ENCOUNTER — Other Ambulatory Visit (HOSPITAL_COMMUNITY)
Admission: RE | Admit: 2017-03-10 | Discharge: 2017-03-10 | Disposition: A | Discharge status: Home / Self Care | Payer: Medicaid Other | Source: Ambulatory Visit | Attending: Certified Nurse Midwife | Admitting: Certified Nurse Midwife

## 2017-03-10 VITALS — BP 92/63 | HR 82 | Wt 124.6 lb

## 2017-03-10 DIAGNOSIS — M549 Dorsalgia, unspecified: Secondary | ICD-10-CM

## 2017-03-10 DIAGNOSIS — O26892 Other specified pregnancy related conditions, second trimester: Secondary | ICD-10-CM

## 2017-03-10 DIAGNOSIS — O09899 Supervision of other high risk pregnancies, unspecified trimester: Secondary | ICD-10-CM

## 2017-03-10 DIAGNOSIS — O09892 Supervision of other high risk pregnancies, second trimester: Secondary | ICD-10-CM

## 2017-03-10 DIAGNOSIS — O9989 Other specified diseases and conditions complicating pregnancy, childbirth and the puerperium: Secondary | ICD-10-CM

## 2017-03-10 DIAGNOSIS — O26899 Other specified pregnancy related conditions, unspecified trimester: Secondary | ICD-10-CM

## 2017-03-10 DIAGNOSIS — O99891 Other specified diseases and conditions complicating pregnancy: Secondary | ICD-10-CM

## 2017-03-10 DIAGNOSIS — Z3480 Encounter for supervision of other normal pregnancy, unspecified trimester: Secondary | ICD-10-CM | POA: Insufficient documentation

## 2017-03-10 DIAGNOSIS — O34219 Maternal care for unspecified type scar from previous cesarean delivery: Secondary | ICD-10-CM

## 2017-03-10 DIAGNOSIS — Z3482 Encounter for supervision of other normal pregnancy, second trimester: Secondary | ICD-10-CM

## 2017-03-10 DIAGNOSIS — R102 Pelvic and perineal pain: Secondary | ICD-10-CM

## 2017-03-10 MED ORDER — COMFORT FIT MATERNITY SUPP LG MISC: 1.0000 [IU] | Freq: Every day | 0 refills | Status: DC

## 2017-03-10 NOTE — Progress Notes (Signed)
Patient reports good fetal movement, denies pain. 

## 2017-03-10 NOTE — Progress Notes (Signed)
   PRENATAL VISIT NOTE  Subjective:  Leslie Shaw is a 31 y.o. G3P1102 at 264w0d being seen today for ongoing prenatal care.  She is currently monitored for the following issues for this low-risk pregnancy and has Supervision of other normal pregnancy, antepartum; Previous cesarean delivery, delivered; Chlamydia infection affecting pregnancy in second trimester; Choroid plexus cyst of fetus, antepartum; and Single umbilical artery, maternal, antepartum on her problem list.  Patient reports backache, no bleeding, no contractions, no cramping, no leaking and round ligament type pain, worse at night.  Discussed increased pillows.  Contractions: Not present. Vag. Bleeding: None.  Movement: Present. Denies leaking of fluid.   The following portions of the patient's history were reviewed and updated as appropriate: allergies, current medications, past family history, past medical history, past social history, past surgical history and problem list. Problem list updated.  Objective:   Vitals:   03/10/17 1311  BP: 92/63  Pulse: 82  Weight: 124 lb 9.6 oz (56.5 kg)    Fetal Status: Fetal Heart Rate (bpm): 145 Fundal Height: 27 cm Movement: Present     General:  Alert, oriented and cooperative. Patient is in no acute distress.  Skin: Skin is warm and dry. No rash noted.   Cardiovascular: Normal heart rate noted  Respiratory: Normal respiratory effort, no problems with respiration noted  Abdomen: Soft, gravid, appropriate for gestational age. Pain/Pressure: Absent     Pelvic:  Cervical exam deferred        Extremities: Normal range of motion.  Edema: None  Mental Status: Normal mood and affect. Normal behavior. Normal judgment and thought content.   Assessment and Plan:  Pregnancy: G3P1102 at 764w0d  1. Supervision of other normal pregnancy, antepartum     Has been fasting for Ramadan, encouraged to stop fasting currently.  TOC for hx of CH. - Cervicovaginal ancillary only - Glucose Tolerance, 2  Hours w/1 Hour - HIV antibody - RPR - CBC  2. [facility], OK per Dr. Jolayne Pantheronstant  3. Single umbilical artery, maternal, antepartum      F/U growth scheduled later this month.  - US MFM OB FOLLOW UP; Future  4. Back pain affecting pregnancy in second trimester     Discussed homeopathic remedies as well - Elastic Bandages & Supports (COMFORT FIT MATERNITY SUPP LG) MISC; 1 Units by Does not apply route daily.  Dispense: 1 each; Refill: 0  5. Pain of round ligament complicating pregnancy, antepartum       - Elastic Bandages & Supports (COMFORT FIT MATERNITY SUPP LG) MISC; 1 Units by Does not apply route daily.  Dispense: 1 each; Refill: 0  Preterm labor symptoms and general obstetric precautions including but not limited to vaginal bleeding, contractions, leaking of fluid and fetal movement were reviewed in detail with the patient. Please refer to After Visit Summary for other counseling recommendations.  Return in about 2 weeks (around 03/24/2017) for ROB.   Roe Coombsachelle A Camden Mazzaferro, CNM

## 2017-03-11 ENCOUNTER — Encounter: Payer: Self-pay | Admitting: *Deleted

## 2017-03-11 ENCOUNTER — Other Ambulatory Visit: Payer: Self-pay | Admitting: Certified Nurse Midwife

## 2017-03-11 DIAGNOSIS — O24419 Gestational diabetes mellitus in pregnancy, unspecified control: Secondary | ICD-10-CM | POA: Insufficient documentation

## 2017-03-11 LAB — CBC
HEMATOCRIT: 35.7 % (ref 34.0–46.6)
HEMOGLOBIN: 11.4 g/dL (ref 11.1–15.9)
MCH: 28.5 pg (ref 26.6–33.0)
MCHC: 31.9 g/dL (ref 31.5–35.7)
MCV: 89 fL (ref 79–97)
Platelets: 214 10*3/uL (ref 150–379)
RBC: 4 x10E6/uL (ref 3.77–5.28)
RDW: 13.1 % (ref 12.3–15.4)
WBC: 10.4 10*3/uL (ref 3.4–10.8)

## 2017-03-11 LAB — GLUCOSE TOLERANCE, 2 HOURS W/ 1HR
GLUCOSE, 1 HOUR: 188 mg/dL — AB (ref 65–179)
Glucose, 2 hour: 149 mg/dL (ref 65–152)
Glucose, Fasting: 86 mg/dL (ref 65–91)

## 2017-03-11 LAB — HIV ANTIBODY (ROUTINE TESTING W REFLEX): HIV Screen 4th Generation wRfx: NONREACTIVE

## 2017-03-11 LAB — RPR: RPR Ser Ql: NONREACTIVE

## 2017-03-12 ENCOUNTER — Other Ambulatory Visit: Payer: Self-pay

## 2017-03-12 DIAGNOSIS — O24419 Gestational diabetes mellitus in pregnancy, unspecified control: Secondary | ICD-10-CM

## 2017-03-12 LAB — CERVICOVAGINAL ANCILLARY ONLY
Chlamydia: NEGATIVE
Neisseria Gonorrhea: NEGATIVE
Trichomonas: NEGATIVE

## 2017-03-12 MED ORDER — ACCU-CHEK GUIDE W/DEVICE KIT: 1.0000 | PACK | Freq: Once | 0 refills | Status: AC

## 2017-03-12 MED ORDER — ACCU-CHEK FASTCLIX LANCETS MISC: 12 refills | Status: DC

## 2017-03-12 MED ORDER — GLUCOSE BLOOD VI STRP: ORAL_STRIP | 12 refills | Status: DC

## 2017-03-23 ENCOUNTER — Ambulatory Visit (INDEPENDENT_AMBULATORY_CARE_PROVIDER_SITE_OTHER): Payer: Medicaid Other | Admitting: Certified Nurse Midwife

## 2017-03-23 ENCOUNTER — Encounter: Payer: Self-pay | Admitting: Certified Nurse Midwife

## 2017-03-23 VITALS — BP 96/62 | HR 86 | Wt 127.0 lb

## 2017-03-23 DIAGNOSIS — O98812 Other maternal infectious and parasitic diseases complicating pregnancy, second trimester: Secondary | ICD-10-CM

## 2017-03-23 DIAGNOSIS — A749 Chlamydial infection, unspecified: Secondary | ICD-10-CM

## 2017-03-23 DIAGNOSIS — O24419 Gestational diabetes mellitus in pregnancy, unspecified control: Secondary | ICD-10-CM

## 2017-03-23 DIAGNOSIS — O34219 Maternal care for unspecified type scar from previous cesarean delivery: Secondary | ICD-10-CM

## 2017-03-23 DIAGNOSIS — O0993 Supervision of high risk pregnancy, unspecified, third trimester: Secondary | ICD-10-CM

## 2017-03-23 DIAGNOSIS — O09899 Supervision of other high risk pregnancies, unspecified trimester: Secondary | ICD-10-CM

## 2017-03-23 DIAGNOSIS — O09892 Supervision of other high risk pregnancies, second trimester: Secondary | ICD-10-CM

## 2017-03-23 NOTE — Progress Notes (Signed)
   PRENATAL VISIT NOTE  Subjective:  Faven Nathanial RancherF Mosso is a 31 y.o. G3P1102 at 326w6d being seen today for ongoing prenatal care.  She is currently monitored for the following issues for this high-risk pregnancy and has Supervision of other normal pregnancy, antepartum; Previous cesarean delivery, delivered; Chlamydia infection affecting pregnancy in second trimester; Choroid plexus cyst of fetus, antepartum; Single umbilical artery, maternal, antepartum; and GDM (gestational diabetes mellitus) on her problem list.  Patient reports no complaints.  Contractions: Not present. Vag. Bleeding: None.  Movement: Present. Denies leaking of fluid.   The following portions of the patient's history were reviewed and updated as appropriate: allergies, current medications, past family history, past medical history, past social history, past surgical history and problem list. Problem list updated.  Objective:   Vitals:   03/23/17 1331  BP: 96/62  Pulse: 86  Weight: 127 lb (57.6 kg)    Fetal Status:     Movement: Present     General:  Alert, oriented and cooperative. Patient is in no acute distress.  Skin: Skin is warm and dry. No rash noted.   Cardiovascular: Normal heart rate noted  Respiratory: Normal respiratory effort, no problems with respiration noted  Abdomen: Soft, gravid, appropriate for gestational age. Pain/Pressure: Absent     Pelvic:  Cervical exam deferred        Extremities: Normal range of motion.  Edema: None  Mental Status: Normal mood and affect. Normal behavior. Normal judgment and thought content.   Assessment and Plan:  Pregnancy: G3P1102 at 526w6d  1. Supervision of other normal pregnancy, antepartum     Doing well.   2. Chlamydia infection affecting pregnancy in second trimester      TOC negative  3. Previous cesarean delivery, delivered     TOLAC  4. Gestational diabetes mellitus (GDM) in third trimester, gestational diabetes method of control unspecified    Blood sugars  reported in normal range - Amb Referral to Nutrition and Diabetic E  5. Single umbilical artery, maternal, antepartum     F/U US scheduled for growth  Preterm labor symptoms and general obstetric precautions including but not limited to vaginal bleeding, contractions, leaking of fluid and fetal movement were reviewed in detail with the patient. Please refer to After Visit Summary for other counseling recommendations.  Return in about 2 weeks (around 04/06/2017) [facility]   Roe Coombsachelle A Enrrique Mierzwa, CNM

## 2017-04-02 ENCOUNTER — Ambulatory Visit (HOSPITAL_COMMUNITY)
Admission: RE | Admit: 2017-04-02 | Discharge: 2017-04-02 | Disposition: A | Discharge status: Home / Self Care | Payer: Medicaid Other | Source: Ambulatory Visit | Attending: Certified Nurse Midwife | Admitting: Certified Nurse Midwife

## 2017-04-02 ENCOUNTER — Other Ambulatory Visit (HOSPITAL_COMMUNITY): Payer: Self-pay | Admitting: Maternal and Fetal Medicine

## 2017-04-02 ENCOUNTER — Encounter (HOSPITAL_COMMUNITY): Payer: Self-pay

## 2017-04-02 ENCOUNTER — Encounter: Payer: Medicaid Other | Attending: Certified Nurse Midwife | Admitting: *Deleted

## 2017-04-02 DIAGNOSIS — O24419 Gestational diabetes mellitus in pregnancy, unspecified control: Secondary | ICD-10-CM | POA: Diagnosis not present

## 2017-04-02 DIAGNOSIS — R7309 Other abnormal glucose: Secondary | ICD-10-CM

## 2017-04-02 DIAGNOSIS — O24410 Gestational diabetes mellitus in pregnancy, diet controlled: Secondary | ICD-10-CM | POA: Diagnosis not present

## 2017-04-02 DIAGNOSIS — O34219 Maternal care for unspecified type scar from previous cesarean delivery: Secondary | ICD-10-CM | POA: Insufficient documentation

## 2017-04-02 DIAGNOSIS — Z3A30 30 weeks gestation of pregnancy: Secondary | ICD-10-CM

## 2017-04-02 DIAGNOSIS — O09219 Supervision of pregnancy with history of pre-term labor, unspecified trimester: Secondary | ICD-10-CM | POA: Diagnosis not present

## 2017-04-02 DIAGNOSIS — Z713 Dietary counseling and surveillance: Secondary | ICD-10-CM | POA: Insufficient documentation

## 2017-04-02 DIAGNOSIS — Z362 Encounter for other antenatal screening follow-up: Secondary | ICD-10-CM | POA: Diagnosis present

## 2017-04-02 DIAGNOSIS — Z3A00 Weeks of gestation of pregnancy not specified: Secondary | ICD-10-CM | POA: Insufficient documentation

## 2017-04-02 DIAGNOSIS — O09899 Supervision of other high risk pregnancies, unspecified trimester: Secondary | ICD-10-CM

## 2017-04-02 NOTE — Progress Notes (Signed)
  Patient was seen on 04/02/2017 for Gestational Diabetes self-management. She is here with Arabic Interpretor, [place] and her husband and 2 children.She states she already has a meter and has been testing her BG twice a day. She states all BG have been within the target ranges. The following learning objectives were met by the patient :   States the definition of Gestational Diabetes  States why dietary management is important in controlling blood glucose  Describes the effects of carbohydrates on blood glucose levels  Demonstrates ability to create a balanced meal plan  Demonstrates carbohydrate counting   States when to check blood glucose levels  Demonstrates proper blood glucose monitoring techniques  States the effect of stress and exercise on blood glucose levels  States the importance of limiting caffeine and abstaining from alcohol and smoking  Plan:  Aim for 3 Carb Choices per meal (45 grams) +/- 1 either way  Aim for 1-2 Carbs per snack Begin reading food labels for Total Carbohydrate of foods Consider  increasing your activity level by walking or other activity daily as tolerated Begin checking BG before breakfast and 2 hours after first bite of breakfast, lunch and dinner as directed by MD  Bring Log Book to every medical appointment   Take medication if directed by MD  Patient already has a meter: [employer] And is testing twice a day. I instructed her to test pre breakfast and 2 hours each meal as directed by MD  Patient instructed to monitor glucose levels: FBS: 60 - 95 mg/dl 2 hour: <120 mg/dl  Patient received the following handouts:in Arabic  Nutrition Diabetes and Pregnancy  Carbohydrate Counting List  Patient will be seen for follow-up as needed.

## 2017-04-03 ENCOUNTER — Other Ambulatory Visit (HOSPITAL_COMMUNITY): Payer: Self-pay | Admitting: *Deleted

## 2017-04-03 DIAGNOSIS — O24415 Gestational diabetes mellitus in pregnancy, controlled by oral hypoglycemic drugs: Secondary | ICD-10-CM

## 2017-04-06 ENCOUNTER — Encounter: Payer: Self-pay | Admitting: *Deleted

## 2017-04-07 ENCOUNTER — Ambulatory Visit (INDEPENDENT_AMBULATORY_CARE_PROVIDER_SITE_OTHER): Payer: Medicaid Other | Admitting: Obstetrics and Gynecology

## 2017-04-07 VITALS — BP 95/62 | HR 92 | Wt 129.0 lb

## 2017-04-07 DIAGNOSIS — O09893 Supervision of other high risk pregnancies, third trimester: Secondary | ICD-10-CM

## 2017-04-07 DIAGNOSIS — O34219 Maternal care for unspecified type scar from previous cesarean delivery: Secondary | ICD-10-CM

## 2017-04-07 DIAGNOSIS — O24410 Gestational diabetes mellitus in pregnancy, diet controlled: Secondary | ICD-10-CM

## 2017-04-07 DIAGNOSIS — Z3480 Encounter for supervision of other normal pregnancy, unspecified trimester: Secondary | ICD-10-CM

## 2017-04-07 DIAGNOSIS — O09899 Supervision of other high risk pregnancies, unspecified trimester: Secondary | ICD-10-CM

## 2017-04-07 NOTE — Progress Notes (Signed)
Subjective:  Leslie Shaw is a 31 y.o. G3P1102 at 4951w0d being seen today for ongoing prenatal care.  She is currently monitored for the following issues for this high-risk pregnancy and has Supervision of other normal pregnancy, antepartum; Previous cesarean delivery, delivered; Single umbilical artery, maternal, antepartum; and GDM (gestational diabetes mellitus) on her problem list.  Patient reports no complaints.  Contractions: Irritability. Vag. Bleeding: None.  Movement: Present. Denies leaking of fluid.   The following portions of the patient's history were reviewed and updated as appropriate: allergies, current medications, past family history, past medical history, past social history, past surgical history and problem list. Problem list updated.  Objective:   Vitals:   04/07/17 1441  BP: 95/62  Pulse: 92  Weight: 129 lb (58.5 kg)    Fetal Status: Fetal Heart Rate (bpm): 139   Movement: Present     General:  Alert, oriented and cooperative. Patient is in no acute distress.  Skin: Skin is warm and dry. No rash noted.   Cardiovascular: Normal heart rate noted  Respiratory: Normal respiratory effort, no problems with respiration noted  Abdomen: Soft, gravid, appropriate for gestational age. Pain/Pressure: Absent     Pelvic:  Cervical exam deferred        Extremities: Normal range of motion.  Edema: None  Mental Status: Normal mood and affect. Normal behavior. Normal judgment and thought content.   Urinalysis:      Assessment and Plan:  Pregnancy: G3P1102 at 3351w0d  1. Diet controlled gestational diabetes mellitus (GDM) in third trimester BS in goal range Continue with diet  2. Single umbilical artery, maternal, antepartum U/S 65 % growth, f/u scheduled  3. Previous cesarean delivery, delivered Desires TOLAC, papers signed 03/11/17  4. Supervision of other normal pregnancy, antepartum Stable  Preterm labor symptoms and general obstetric precautions including but not  limited to vaginal bleeding, contractions, leaking of fluid and fetal movement were reviewed in detail with the patient. Please refer to After Visit Summary for other counseling recommendations.  Return in about 2 weeks (around 04/21/2017).   Hermina StaggersErvin, Guillaume Weninger L, MD

## 2017-04-09 ENCOUNTER — Ambulatory Visit: Payer: Medicaid Other | Admitting: Skilled Nursing Facility1

## 2017-04-22 ENCOUNTER — Ambulatory Visit (INDEPENDENT_AMBULATORY_CARE_PROVIDER_SITE_OTHER): Payer: Medicaid Other | Admitting: Obstetrics & Gynecology

## 2017-04-22 ENCOUNTER — Encounter: Payer: Self-pay | Admitting: Obstetrics & Gynecology

## 2017-04-22 VITALS — BP 105/71 | HR 103 | Wt 130.4 lb

## 2017-04-22 DIAGNOSIS — O24410 Gestational diabetes mellitus in pregnancy, diet controlled: Secondary | ICD-10-CM

## 2017-04-22 DIAGNOSIS — Z3480 Encounter for supervision of other normal pregnancy, unspecified trimester: Secondary | ICD-10-CM

## 2017-04-22 NOTE — Progress Notes (Signed)
   PRENATAL VISIT NOTE  Subjective:  Leslie Shaw is a 31 y.o. G3P1102 at 6831w1d being seen today for ongoing prenatal care.  She is currently monitored for the following issues for this high-risk pregnancy and has Supervision of other normal pregnancy, antepartum; Previous cesarean delivery, delivered; Single umbilical artery, maternal, antepartum; and GDM (gestational diabetes mellitus) on her problem list.  Patient reports occasional contractions and s/p pain and pressure.  Contractions: Irritability. Vag. Bleeding: None.  Movement: Present. Denies leaking of fluid.   The following portions of the patient's history were reviewed and updated as appropriate: allergies, current medications, past family history, past medical history, past social history, past surgical history and problem list. Problem list updated.  Objective:   Vitals:   04/22/17 1432  BP: 105/71  Pulse: (!) 103  Weight: 130 lb 6.4 oz (59.1 kg)    Fetal Status: Fetal Heart Rate (bpm): 140   Movement: Present     General:  Alert, oriented and cooperative. Patient is in no acute distress.  Skin: Skin is warm and dry. No rash noted.   Cardiovascular: Normal heart rate noted  Respiratory: Normal respiratory effort, no problems with respiration noted  Abdomen: Soft, gravid, appropriate for gestational age.  Pain/Pressure: Absent     Pelvic: Cervical exam deferred        Extremities: Normal range of motion.  Edema: None  Mental Status:  Normal mood and affect. Normal behavior. Normal judgment and thought content.   Assessment and Plan:  Pregnancy: G3P1102 at 6831w1d  1. Supervision of other normal pregnancy, antepartum States she will transfer care to [facility] offered no explanation  2. Diet controlled gestational diabetes mellitus (GDM) in third trimester FBS and PP are normal, US is scheduled 7/26  Preterm labor symptoms and general obstetric precautions including but not limited to vaginal bleeding, contractions, leaking  of fluid and fetal movement were reviewed in detail with the patient. Please refer to After Visit Summary for other counseling recommendations.  Return in about 2 weeks (around 05/06/2017). or will transfer care   Scheryl DarterJames Breck [place], MD

## 2017-04-22 NOTE — Patient Instructions (Signed)
Gestational Diabetes Mellitus, Self Care Caring for yourself after you have been diagnosed with gestational diabetes (gestational diabetes mellitus) means keeping your blood sugar (glucose) under control with a balance of:  Nutrition.  Exercise.  Lifestyle changes.  Medicines or insulin, if necessary.  Support from your team of health care providers and others.  The following information explains what you need to know to manage your gestational diabetes at home. What do I need to do to manage my blood glucose?  Check your blood glucose every day during your pregnancy. Do this as often as told by your health care provider.  Contact your health care provider if your blood glucose is above your target for 2 tests in a row. Your health care provider will set individualized treatment goals for you. Generally, the goal of treatment is to maintain the following blood glucose levels during pregnancy:  After not eating for 8 hours (after fasting): at or below 95 mg/dL (5.3 mmol/L).  After meals (postprandial): ? One hour after a meal: at or below 140 mg/dL (7.8 mmol/L). ? Two hours after a meal: at or below 120 mg/dL (6.7 mmol/L).  A1c (hemoglobin A1c) level: 6-6.5%.  What do I need to know about hyperglycemia and hypoglycemia? What is hyperglycemia? Hyperglycemia, also called high blood glucose, occurs when blood glucose is too high. Make sure you know the early signs of hyperglycemia, such as:  Increased thirst.  Hunger.  Feeling very tired.  Needing to urinate more often than usual.  Blurry vision.  What is hypoglycemia? Hypoglycemia, also called low blood glucose, occurswith a blood glucose level at or below 70 mg/dL (3.9 mmol/L). The risk for hypoglycemia increases during or after exercise, during sleep, during illness, and when skipping meals or not eating for a long time (fasting). It is important to know the symptoms of hypoglycemia and treat it right away. Always have a  15-gram rapid-acting carbohydrate snack with you to treat low blood glucose.Family members and close friends should also know the symptoms and should understand how to treat hypoglycemia, in case you are not able to treat yourself. What are the symptoms of hypoglycemia? Hypoglycemia symptoms can include:  Hunger.  Anxiety.  Sweating and feeling clammy.  Confusion.  Dizziness or feeling light-headed.  Sleepiness.  Nausea.  Increased heart rate.  Headache.  Blurry vision.  Seizure.  Nightmares.  Tingling or numbness around the mouth, lips, or tongue.  A change in speech.  Decreased ability to concentrate.  A change in coordination.  Restless sleep.  Tremors or shakes.  Fainting.  Irritability.  How do I treat hypoglycemia?  If you are alert and able to swallow safely, follow the 15:15 rule:  Take 15 grams of a rapid-acting carbohydrate. Rapid-acting options include: ? 1 tube of glucose gel. ? 3 glucose pills. ? 6-8 pieces of hard candy. ? 4 oz (120 mL) of fruit juice. ? 4 oz (120 mL) of regular (not diet) soda.  Check your blood glucose 15 minutes after you take the carbohydrate.  If the repeat blood glucose level is still at or below 70 mg/dL (3.9 mmol/L), take 15 grams of a carbohydrate again.  If your blood glucose level does not increase above 70 mg/dL (3.9 mmol/L) after 3 tries, seek emergency medical care.  After your blood glucose level returns to normal, eat a meal or a snack within 1 hour.  How do I treat severe hypoglycemia? Severe hypoglycemia is when your blood glucose level is at or below 54 mg/dL (  3 mmol/L). Severe hypoglycemia is an emergency. Do not wait to see if the symptoms will go away. Get medical help right away. Call your local emergency services (911 in the [place] Do not drive yourself to the hospital. If you have severe hypoglycemia and you cannot eat or drink, you may need an injection of glucagon. A family member or close  friend should learn how to check your blood glucose and how to give you a glucagon injection. Ask your health care provider if you need to have an emergency glucagon injection kit available. Severe hypoglycemia may need to be treated in a hospital. The treatment may include getting glucose through an IV tube. You may also need treatment for the cause of your hypoglycemia. What else can I do to manage my gestational diabetes? Take your diabetes medicines as told  If your health care provider prescribed insulin or diabetes medicines, take them every day.  Do not run out of insulin or other diabetes medicines that you take. Plan ahead so you always have these available.  If you use insulin, adjust your dosage based on how physically active you are and what foods you eat. Your health care provider will tell you how to adjust your dosage. Make healthy food choices  The things that you eat and drink affect your blood glucose. Making good choices helps to control your diabetes and prevent other health problems. A healthy meal plan includes eating lean proteins, complex carbohydrates, fresh fruits and vegetables, low-fat dairy products, and healthy fats. Make an appointment to see a diet and nutrition specialist (registered dietitian) to help you create an eating plan that is right for you. Make sure that you:  Follow instructions from your health care provider about eating or drinking restrictions.  Drink enough fluid to keep your urine clear or pale yellow.  Eat healthy snacks between nutritious meals.  Track the carbohydrates that you eat. Do this by reading food labels and learning the standard serving sizes of foods.  Follow your sick day plan whenever you cannot eat or drink as usual. Make this plan in advance with your health care provider.  Stay active   Do at least 30 minutes of physical activity a day, or as much physical activity as your health care provider recommends during your  pregnancy. ? Doing 10 minutes of exercise 30 minutes after each meal may help to control postprandial blood glucose levels.  If you start a new exercise or activity, work with your health care provider to adjust your insulin, medicines, or food intake as needed. Make healthy lifestyle choices  Do not drink alcohol.  Do not use any tobacco products, such as cigarettes, chewing tobacco, and e-cigarettes. If you need help quitting, ask your health care provider.  Learn to manage stress. If you need help with this, ask your health care provider. Care for your body  Keep your immunizations up to date.  Brush your teeth and gums two times a day, and floss at least one time a day.  Visit your dentist at least once every 6 months.  Maintain a healthy weight during your pregnancy. General instructions   Take over-the-counter and prescription medicines only as told by your health care provider.  Talk with your health care provider about your risk for high blood pressure during pregnancy (preeclampsia or eclampsia).  Share your diabetes management plan with people in your workplace, school, and household.  Check your urine for ketones during your pregnancy when you are ill and   as told by your health care provider.  Carry a medical alert card or wear medical alert jewelry.  Ask your health care provider: ? Do I need to meet with a diabetes educator? ? Where can I find a support group for people with diabetes?  Keep all follow-up visits during your pregnancy (prenatal) and after delivery (postnatal) as told by your health care provider. This is important. Get the care that you need after delivery  Have your blood glucose level checked 4-12 weeks after delivery. This is done with an oral glucose tolerance test (OGTT).  Get screened for diabetes at least every 3 years, or as often as told by your health care provider. Where to find more information: To learn more about gestational  diabetes, visit:  American Diabetes Association (ADA): www.diabetes.org/diabetes-basics/gestational  Centers for Disease Control and Prevention (CDC): www.cdc.gov/diabetes/pubs/pdf/gestationalDiabetes.pdf  This information is not intended to replace advice given to you by your health care provider. Make sure you discuss any questions you have with your health care provider. Document Released: 01/14/2016 Document Revised: 02/28/2016 Document Reviewed: 10/26/2015 Elsevier Interactive Patient Education  2017 Elsevier Inc.  

## 2017-04-30 ENCOUNTER — Encounter (HOSPITAL_COMMUNITY): Payer: Self-pay

## 2017-04-30 ENCOUNTER — Ambulatory Visit (HOSPITAL_COMMUNITY)
Admission: RE | Admit: 2017-04-30 | Discharge: 2017-04-30 | Disposition: A | Discharge status: Home / Self Care | Payer: Medicaid Other | Source: Ambulatory Visit | Attending: Certified Nurse Midwife | Admitting: Certified Nurse Midwife

## 2017-04-30 ENCOUNTER — Other Ambulatory Visit (HOSPITAL_COMMUNITY): Payer: Self-pay | Admitting: Maternal and Fetal Medicine

## 2017-04-30 DIAGNOSIS — O358XX0 Maternal care for other (suspected) fetal abnormality and damage, not applicable or unspecified: Secondary | ICD-10-CM | POA: Insufficient documentation

## 2017-04-30 DIAGNOSIS — O34219 Maternal care for unspecified type scar from previous cesarean delivery: Secondary | ICD-10-CM | POA: Diagnosis not present

## 2017-04-30 DIAGNOSIS — O09899 Supervision of other high risk pregnancies, unspecified trimester: Secondary | ICD-10-CM

## 2017-04-30 DIAGNOSIS — O24415 Gestational diabetes mellitus in pregnancy, controlled by oral hypoglycemic drugs: Secondary | ICD-10-CM | POA: Diagnosis present

## 2017-04-30 DIAGNOSIS — Z3A34 34 weeks gestation of pregnancy: Secondary | ICD-10-CM | POA: Insufficient documentation

## 2017-04-30 DIAGNOSIS — O09213 Supervision of pregnancy with history of pre-term labor, third trimester: Secondary | ICD-10-CM | POA: Diagnosis not present

## 2017-05-01 ENCOUNTER — Other Ambulatory Visit (HOSPITAL_COMMUNITY): Payer: Self-pay | Admitting: *Deleted

## 2017-05-01 DIAGNOSIS — O24410 Gestational diabetes mellitus in pregnancy, diet controlled: Secondary | ICD-10-CM

## 2017-05-07 ENCOUNTER — Ambulatory Visit (INDEPENDENT_AMBULATORY_CARE_PROVIDER_SITE_OTHER): Payer: Medicaid Other | Admitting: Obstetrics and Gynecology

## 2017-05-07 VITALS — BP 104/69 | HR 90 | Wt 132.2 lb

## 2017-05-07 DIAGNOSIS — O24410 Gestational diabetes mellitus in pregnancy, diet controlled: Secondary | ICD-10-CM

## 2017-05-07 DIAGNOSIS — Z3480 Encounter for supervision of other normal pregnancy, unspecified trimester: Secondary | ICD-10-CM

## 2017-05-07 DIAGNOSIS — Z23 Encounter for immunization: Secondary | ICD-10-CM | POA: Diagnosis not present

## 2017-05-07 DIAGNOSIS — O34219 Maternal care for unspecified type scar from previous cesarean delivery: Secondary | ICD-10-CM

## 2017-05-07 MED ORDER — TETANUS-DIPHTH-ACELL PERTUSSIS 5-2.5-18.5 LF-MCG/0.5 IM SUSP
0.5000 mL | Freq: Once | INTRAMUSCULAR | Status: AC
  Administered 2017-05-07: 0.5 mL via INTRAMUSCULAR

## 2017-05-07 NOTE — Progress Notes (Signed)
   PRENATAL VISIT NOTE  Subjective:  Leslie Shaw is a 31 y.o. G3P1102 at 1036w2d being seen today for ongoing prenatal care.  She is currently monitored for the following issues for this high-risk pregnancy and has Supervision of other normal pregnancy, antepartum; Previous cesarean delivery, delivered; Single umbilical artery, maternal, antepartum; and GDM (gestational diabetes mellitus) on her problem list.  Patient reports no complaints.  Contractions: Irregular. Vag. Bleeding: None.  Movement: Present. Denies leaking of fluid.   The following portions of the patient's history were reviewed and updated as appropriate: allergies, current medications, past family history, past medical history, past social history, past surgical history and problem list. Problem list updated.  Objective:   Vitals:   05/07/17 1338  BP: 104/69  Pulse: 90  Weight: 132 lb 3.2 oz (60 kg)    Fetal Status: Fetal Heart Rate (bpm): 153 Fundal Height: 35 cm Movement: Present     General:  Alert, oriented and cooperative. Patient is in no acute distress.  Skin: Skin is warm and dry. No rash noted.   Cardiovascular: Normal heart rate noted  Respiratory: Normal respiratory effort, no problems with respiration noted  Abdomen: Soft, gravid, appropriate for gestational age.  Pain/Pressure: Absent     Pelvic: Cervical exam deferred        Extremities: Normal range of motion.  Edema: None  Mental Status:  Normal mood and affect. Normal behavior. Normal judgment and thought content.   Assessment and Plan:  Pregnancy: G3P1102 at 6636w2d  1. Supervision of other normal pregnancy, antepartum Patient is doing well  Cultures next visit Patient agreed to TDap today  2. Diet controlled gestational diabetes mellitus (GDM) in third trimester Patient is checking CBG twice daily. Fasting and 2 hr after lunch. All values within range Follow up growth due to single umbilical artery on 8/23  3. Previous cesarean delivery,  delivered Desires TOLAC- consent signed  Preterm labor symptoms and general obstetric precautions including but not limited to vaginal bleeding, contractions, leaking of fluid and fetal movement were reviewed in detail with the patient. Please refer to After Visit Summary for other counseling recommendations.  Return in about 1 week (around 05/14/2017) for ROB.   Catalina AntiguaPeggy Emmalene Kattner, MD

## 2017-05-18 ENCOUNTER — Ambulatory Visit (INDEPENDENT_AMBULATORY_CARE_PROVIDER_SITE_OTHER): Payer: Medicaid Other | Admitting: Obstetrics and Gynecology

## 2017-05-18 ENCOUNTER — Other Ambulatory Visit (HOSPITAL_COMMUNITY)
Admission: RE | Admit: 2017-05-18 | Discharge: 2017-05-18 | Disposition: A | Discharge status: Home / Self Care | Payer: Medicaid Other | Source: Ambulatory Visit | Attending: Obstetrics and Gynecology | Admitting: Obstetrics and Gynecology

## 2017-05-18 VITALS — BP 111/73 | HR 82 | Wt 133.0 lb

## 2017-05-18 DIAGNOSIS — Z3480 Encounter for supervision of other normal pregnancy, unspecified trimester: Secondary | ICD-10-CM | POA: Diagnosis present

## 2017-05-18 DIAGNOSIS — O24410 Gestational diabetes mellitus in pregnancy, diet controlled: Secondary | ICD-10-CM

## 2017-05-18 DIAGNOSIS — Z3A00 Weeks of gestation of pregnancy not specified: Secondary | ICD-10-CM | POA: Insufficient documentation

## 2017-05-18 DIAGNOSIS — O34219 Maternal care for unspecified type scar from previous cesarean delivery: Secondary | ICD-10-CM

## 2017-05-18 LAB — OB RESULTS CONSOLE GBS: GBS: POSITIVE

## 2017-05-18 NOTE — Progress Notes (Signed)
   PRENATAL VISIT NOTE  Subjective:  Leslie Shaw is a 31 y.o. G3P1102 at 2267w6d being seen today for ongoing prenatal care.  She is currently monitored for the following issues for this high-risk pregnancy and has Supervision of other normal pregnancy, antepartum; Previous cesarean delivery, delivered; Single umbilical artery, maternal, antepartum; and GDM (gestational diabetes mellitus) on her problem list.  Patient reports no complaints.  Contractions: Irregular. Vag. Bleeding: None.  Movement: Present. Denies leaking of fluid.   The following portions of the patient's history were reviewed and updated as appropriate: allergies, current medications, past family history, past medical history, past social history, past surgical history and problem list. Problem list updated.  Objective:   Vitals:   05/18/17 1547 05/18/17 1601  BP: 111/73 111/73  Pulse: 82 82  Weight: 133 lb (60.3 kg) 133 lb (60.3 kg)    Fetal Status: Fetal Heart Rate (bpm): 160 Fundal Height: 36 cm Movement: Present  Presentation: Vertex  General:  Alert, oriented and cooperative. Patient is in no acute distress.  Skin: Skin is warm and dry. No rash noted.   Cardiovascular: Normal heart rate noted  Respiratory: Normal respiratory effort, no problems with respiration noted  Abdomen: Soft, gravid, appropriate for gestational age.  Pain/Pressure: Absent     Pelvic: Cervical exam performed Dilation: 1 Effacement (%): Thick Station: -3  Extremities: Normal range of motion.  Edema: None  Mental Status:  Normal mood and affect. Normal behavior. Normal judgment and thought content.   Assessment and Plan:  Pregnancy: G3P1102 at 9567w6d  1. Supervision of other normal pregnancy, antepartum Patient is doing well without complaints Cultures today - Strep Gp B NAA - Cervicovaginal ancillary only  2. Diet controlled gestational diabetes mellitus (GDM) in third trimester CBG reviewed and all within range  Follow up growth  ultrasound 8/23  3. Previous cesarean delivery, delivered Desires TOLAC Plan for IOL at 40 weeks if no SOL Answered questions regarding L&D and postpartum experience  Preterm labor symptoms and general obstetric precautions including but not limited to vaginal bleeding, contractions, leaking of fluid and fetal movement were reviewed in detail with the patient. Please refer to After Visit Summary for other counseling recommendations.  Return in about 1 week (around 05/25/2017) for ROB.   Catalina AntiguaPeggy Erasmo Vertz, MD

## 2017-05-18 NOTE — Progress Notes (Signed)
Clinical research associateArabic Interpreter # [phone]

## 2017-05-20 ENCOUNTER — Encounter: Payer: Self-pay | Admitting: Obstetrics and Gynecology

## 2017-05-20 DIAGNOSIS — O99820 Streptococcus B carrier state complicating pregnancy: Secondary | ICD-10-CM | POA: Insufficient documentation

## 2017-05-20 LAB — STREP GP B NAA: STREP GROUP B AG: POSITIVE — AB

## 2017-05-20 LAB — CERVICOVAGINAL ANCILLARY ONLY
CHLAMYDIA, DNA PROBE: NEGATIVE
NEISSERIA GONORRHEA: NEGATIVE

## 2017-05-21 ENCOUNTER — Inpatient Hospital Stay (HOSPITAL_COMMUNITY)
Admission: AD | Admit: 2017-05-21 | Discharge: 2017-05-21 | Disposition: A | Discharge status: Home / Self Care | Payer: Medicaid Other | Source: Ambulatory Visit | Attending: Family Medicine | Admitting: Family Medicine

## 2017-05-21 ENCOUNTER — Encounter (HOSPITAL_COMMUNITY): Payer: Self-pay

## 2017-05-21 DIAGNOSIS — Z3A37 37 weeks gestation of pregnancy: Secondary | ICD-10-CM | POA: Insufficient documentation

## 2017-05-21 DIAGNOSIS — O479 False labor, unspecified: Secondary | ICD-10-CM

## 2017-05-21 DIAGNOSIS — O471 False labor at or after 37 completed weeks of gestation: Secondary | ICD-10-CM | POA: Insufficient documentation

## 2017-05-21 LAB — AMNISURE RUPTURE OF MEMBRANE (ROM) NOT AT ARMC: AMNISURE: NEGATIVE

## 2017-05-21 LAB — POCT FERN TEST: POCT Fern Test: NEGATIVE

## 2017-05-21 NOTE — MAU Note (Signed)
Pt presents to MAU with PROM (0200), pt states that contractions started at 2300. +FM. No vaginal bleeding.

## 2017-05-21 NOTE — Discharge Instructions (Signed)

## 2017-05-21 NOTE — MAU Provider Note (Signed)
Leslie Shaw is a 31 y.o. female 403P1102 @ 7537w2d here in MAU with leaking of fluid. RN to collect Amnisure.     Venia Carbonasch, Jennifer I, NP 05/21/2017 5:14 AM

## 2017-05-21 NOTE — MAU Provider Note (Signed)
  History    CSN: [id number]  Arrival date and time: 05/21/17 0343   None     Chief Complaint  Patient presents with  . Labor Eval   HPI Patient presents to MAU for labor check. She started having contractions this morning.   OB history Prior c-section -> wants TOLAC GDM-diet controlled GBS+  History reviewed. No pertinent past medical history.  Past Surgical History:  Procedure Laterality Date  . APPENDECTOMY    . CESAREAN SECTION      History reviewed. No pertinent family history.  Social History  Substance Use Topics  . Smoking status: Never Smoker  . Smokeless tobacco: Never Used  . Alcohol use No    Allergies:  Allergies  Allergen Reactions  . Other Swelling    Tree nuts- pine cone  . Pistachio Nut (Diagnostic) Swelling    Prescriptions Prior to Admission  Medication Sig Dispense Refill Last Dose  . ACCU-CHEK FASTCLIX LANCETS MISC Am Fasting and 2 Hrs. After each meal 100 each 12 Taking  . Elastic Bandages & Supports (COMFORT FIT MATERNITY SUPP LG) MISC 1 Units by Does not apply route daily. 1 each 0 Taking  . glucose blood (ACCU-CHEK GUIDE) test strip AM fasting and 2 hrs. After each meal 100 each 12 Taking  . Prenatal Vit-Fe Fum-Fe Bisg-FA (NATACHEW) 28-1 MG CHEW Chew 1 tablet by mouth daily. 30 tablet 12 Taking    Review of Systems Physical Exam   Blood pressure 104/73, pulse 80, temperature 97.7 F (36.5 C), temperature source Oral, resp. rate 16, last menstrual period 09/02/2016, SpO2 100 %.  Physical Exam Constitutional: Well-developed, well-nourished female in no acute distress.  Cardiovascular: normal rate Respiratory: normal effort GI: Abd soft, non-tender, gravid appropriate for gestational age. MS: Extremities nontender, no edema, normal ROM  MAU Course  Procedures  MDM I personally reviewed the patient's NST today, found to be REACTIVE. 125 bpm, mod var, +accels, no decels. CTX: 8 min.  Assessment and Plan  False  Labor -discharge home ins table condition -discussed signs of labor -TOLAC -keep OB appointments   Caryl AdaJazma Terren Haberle, DO OB Fellow 05/21/2017, 10:53 AM

## 2017-05-23 ENCOUNTER — Inpatient Hospital Stay (HOSPITAL_COMMUNITY): Payer: Medicaid Other | Admitting: Anesthesiology

## 2017-05-23 ENCOUNTER — Encounter (HOSPITAL_COMMUNITY): Payer: Self-pay | Admitting: *Deleted

## 2017-05-23 ENCOUNTER — Inpatient Hospital Stay (HOSPITAL_COMMUNITY)
Admission: AD | Admit: 2017-05-23 | Discharge: 2017-05-25 | DRG: 775 | Disposition: A | Discharge status: Home / Self Care | Payer: Medicaid Other | Source: Ambulatory Visit | Attending: Obstetrics and Gynecology | Admitting: Obstetrics and Gynecology

## 2017-05-23 DIAGNOSIS — O4202 Full-term premature rupture of membranes, onset of labor within 24 hours of rupture: Secondary | ICD-10-CM

## 2017-05-23 DIAGNOSIS — Z3A37 37 weeks gestation of pregnancy: Secondary | ICD-10-CM

## 2017-05-23 DIAGNOSIS — O99824 Streptococcus B carrier state complicating childbirth: Secondary | ICD-10-CM | POA: Diagnosis present

## 2017-05-23 DIAGNOSIS — O99820 Streptococcus B carrier state complicating pregnancy: Secondary | ICD-10-CM

## 2017-05-23 DIAGNOSIS — O4292 Full-term premature rupture of membranes, unspecified as to length of time between rupture and onset of labor: Secondary | ICD-10-CM | POA: Diagnosis present

## 2017-05-23 DIAGNOSIS — O24429 Gestational diabetes mellitus in childbirth, unspecified control: Secondary | ICD-10-CM

## 2017-05-23 DIAGNOSIS — O4200 Premature rupture of membranes, onset of labor within 24 hours of rupture, unspecified weeks of gestation: Secondary | ICD-10-CM | POA: Diagnosis present

## 2017-05-23 DIAGNOSIS — O34211 Maternal care for low transverse scar from previous cesarean delivery: Secondary | ICD-10-CM | POA: Diagnosis present

## 2017-05-23 DIAGNOSIS — O24420 Gestational diabetes mellitus in childbirth, diet controlled: Secondary | ICD-10-CM | POA: Diagnosis present

## 2017-05-23 DIAGNOSIS — O09899 Supervision of other high risk pregnancies, unspecified trimester: Secondary | ICD-10-CM

## 2017-05-23 DIAGNOSIS — O4210 Premature rupture of membranes, onset of labor more than 24 hours following rupture, unspecified weeks of gestation: Secondary | ICD-10-CM | POA: Diagnosis present

## 2017-05-23 LAB — CBC
HEMATOCRIT: 33.3 % — AB (ref 36.0–46.0)
HEMOGLOBIN: 11 g/dL — AB (ref 12.0–15.0)
MCH: 27.3 pg (ref 26.0–34.0)
MCHC: 33 g/dL (ref 30.0–36.0)
MCV: 82.6 fL (ref 78.0–100.0)
Platelets: 184 10*3/uL (ref 150–400)
RBC: 4.03 MIL/uL (ref 3.87–5.11)
RDW: 14.6 % (ref 11.5–15.5)
WBC: 8.5 10*3/uL (ref 4.0–10.5)

## 2017-05-23 LAB — GLUCOSE, CAPILLARY
GLUCOSE-CAPILLARY: 84 mg/dL (ref 65–99)
Glucose-Capillary: 96 mg/dL (ref 65–99)

## 2017-05-23 LAB — POCT FERN TEST: POCT Fern Test: POSITIVE

## 2017-05-23 MED ORDER — PENICILLIN G POTASSIUM 5000000 UNITS IJ SOLR
5.0000 10*6.[IU] | Freq: Once | INTRAVENOUS | Status: AC
  Administered 2017-05-23: 5 10*6.[IU] via INTRAVENOUS
  Filled 2017-05-23: qty 5

## 2017-05-23 MED ORDER — LACTATED RINGERS IV SOLN: 500.0000 mL | Freq: Once | INTRAVENOUS | Status: DC

## 2017-05-23 MED ORDER — PHENYLEPHRINE 40 MCG/ML (10ML) SYRINGE FOR IV PUSH (FOR BLOOD PRESSURE SUPPORT): 80.0000 ug | PREFILLED_SYRINGE | INTRAVENOUS | Status: DC | PRN

## 2017-05-23 MED ORDER — LACTATED RINGERS IV SOLN: 500.0000 mL | INTRAVENOUS | Status: DC | PRN

## 2017-05-23 MED ORDER — ACETAMINOPHEN 325 MG PO TABS: 650.0000 mg | ORAL_TABLET | ORAL | Status: DC | PRN

## 2017-05-23 MED ORDER — PHENYLEPHRINE 40 MCG/ML (10ML) SYRINGE FOR IV PUSH (FOR BLOOD PRESSURE SUPPORT)
80.0000 ug | PREFILLED_SYRINGE | INTRAVENOUS | Status: DC | PRN
  Filled 2017-05-23: qty 10
  Filled 2017-05-23: qty 5

## 2017-05-23 MED ORDER — OXYCODONE-ACETAMINOPHEN 5-325 MG PO TABS: 2.0000 | ORAL_TABLET | ORAL | Status: DC | PRN

## 2017-05-23 MED ORDER — LACTATED RINGERS IV SOLN
500.0000 mL | Freq: Once | INTRAVENOUS | Status: AC
  Administered 2017-05-23: 500 mL via INTRAVENOUS

## 2017-05-23 MED ORDER — FENTANYL 2.5 MCG/ML BUPIVACAINE 1/10 % EPIDURAL INFUSION (WH - ANES)
14.0000 mL/h | INTRAMUSCULAR | Status: DC | PRN
  Administered 2017-05-23: 12 mL/h via EPIDURAL
  Filled 2017-05-23: qty 100

## 2017-05-23 MED ORDER — ONDANSETRON HCL 4 MG/2ML IJ SOLN
4.0000 mg | Freq: Four times a day (QID) | INTRAMUSCULAR | Status: DC | PRN
Start: 2017-05-23 — End: 2017-05-24

## 2017-05-23 MED ORDER — DIPHENHYDRAMINE HCL 50 MG/ML IJ SOLN: 12.5000 mg | INTRAMUSCULAR | Status: DC | PRN

## 2017-05-23 MED ORDER — SOD CITRATE-CITRIC ACID 500-334 MG/5ML PO SOLN: 30.0000 mL | ORAL | Status: DC | PRN

## 2017-05-23 MED ORDER — OXYTOCIN 40 UNITS IN LACTATED RINGERS INFUSION - SIMPLE MED
1.0000 m[IU]/min | INTRAVENOUS | Status: DC
  Administered 2017-05-23: 2 m[IU]/min via INTRAVENOUS

## 2017-05-23 MED ORDER — PENICILLIN G POT IN DEXTROSE 60000 UNIT/ML IV SOLN
3.0000 10*6.[IU] | INTRAVENOUS | Status: DC
  Administered 2017-05-23: 3 10*6.[IU] via INTRAVENOUS
  Filled 2017-05-23 (×5): qty 50

## 2017-05-23 MED ORDER — TERBUTALINE SULFATE 1 MG/ML IJ SOLN
0.2500 mg | Freq: Once | INTRAMUSCULAR | Status: DC | PRN
  Filled 2017-05-23: qty 1

## 2017-05-23 MED ORDER — EPHEDRINE 5 MG/ML INJ: 10.0000 mg | INTRAVENOUS | Status: DC | PRN

## 2017-05-23 MED ORDER — OXYTOCIN 40 UNITS IN LACTATED RINGERS INFUSION - SIMPLE MED
2.5000 [IU]/h | INTRAVENOUS | Status: DC
  Filled 2017-05-23: qty 1000

## 2017-05-23 MED ORDER — OXYCODONE-ACETAMINOPHEN 5-325 MG PO TABS: 1.0000 | ORAL_TABLET | ORAL | Status: DC | PRN

## 2017-05-23 MED ORDER — OXYTOCIN BOLUS FROM INFUSION
500.0000 mL | Freq: Once | INTRAVENOUS | Status: AC
  Administered 2017-05-23: 500 mL via INTRAVENOUS

## 2017-05-23 MED ORDER — EPHEDRINE 5 MG/ML INJ
10.0000 mg | INTRAVENOUS | Status: DC | PRN
  Filled 2017-05-23: qty 2

## 2017-05-23 MED ORDER — IBUPROFEN 600 MG PO TABS
600.0000 mg | ORAL_TABLET | Freq: Four times a day (QID) | ORAL | Status: DC
  Administered 2017-05-24 – 2017-05-25 (×6): 600 mg via ORAL
  Filled 2017-05-23 (×7): qty 1

## 2017-05-23 MED ORDER — LIDOCAINE HCL (PF) 1 % IJ SOLN
INTRAMUSCULAR | Status: DC | PRN
  Administered 2017-05-23: 5 mL via EPIDURAL

## 2017-05-23 MED ORDER — LACTATED RINGERS IV SOLN
INTRAVENOUS | Status: DC
  Administered 2017-05-23: 22:00:00 via INTRAVENOUS

## 2017-05-23 MED ORDER — PHENYLEPHRINE 40 MCG/ML (10ML) SYRINGE FOR IV PUSH (FOR BLOOD PRESSURE SUPPORT)
80.0000 ug | PREFILLED_SYRINGE | INTRAVENOUS | Status: DC | PRN
  Filled 2017-05-23: qty 5

## 2017-05-23 MED ORDER — LIDOCAINE HCL (PF) 1 % IJ SOLN
30.0000 mL | INTRAMUSCULAR | Status: DC | PRN
  Filled 2017-05-23: qty 30

## 2017-05-23 NOTE — H&P (Signed)
LABOR ADMISSION HISTORY AND PHYSICAL  Leslie Shaw is a 31 y.o. female [phone] with IUP at [redacted]w[redacted]d by LMP presenting for PROM at 12pm today.   She reports +FMs, No LOF, no VB, no blurry vision, no headaches, no peripheral edema, and no RUQ pain.  She plans on breast feeding. She requests POP for birth control.  Dating: By LMP --->  Estimated Date of Delivery: 06/09/17  Prenatal History/Complications: GDMA1 VBAC Rubella virus +  Single umbilical artery +chlamydia on 12/19/16, treated  Past Medical History: History reviewed. No pertinent past medical history.  Past Surgical History: Past Surgical History:  Procedure Laterality Date  . APPENDECTOMY    . CESAREAN SECTION      Obstetrical History: OB History    Gravida Para Term Preterm AB Living   3 2 1 1   2    SAB TAB Ectopic Multiple Live Births           2      Obstetric Comments   Patient had twin pregnancy- she had C-section delivery at 36 weeks- emergent delivery water broke- week 11 they knew of 1 twin demise      Social History: Social History   Social History  . Marital status: Married    Spouse name: N/A  . Number of children: N/A  . Years of education: N/A   Social History Main Topics  . Smoking status: Never Smoker  . Smokeless tobacco: Never Used  . Alcohol use No  . Drug use: No  . Sexual activity: Yes    Birth control/ protection: None   Other Topics Concern  . None   Social History Narrative  . None    Family History: History reviewed. No pertinent family history.  Allergies: Allergies  Allergen Reactions  . Other Swelling    Tree nuts- pine cone  . Pistachio Nut (Diagnostic) Swelling    Prescriptions Prior to Admission  Medication Sig Dispense Refill Last Dose  . colchicine 0.6 MG tablet Take 0.6 mg by mouth daily.   05/22/2017 at Unknown time  . Prenatal Vit-Fe Fumarate-FA (PRENATAL MULTIVITAMIN) TABS tablet Take 1 tablet by mouth daily at 12 noon.   05/22/2017 at Unknown time  .  ACCU-CHEK FASTCLIX LANCETS MISC Am Fasting and 2 Hrs. After each meal 100 each 12 Taking  . Elastic Bandages & Supports (COMFORT FIT MATERNITY SUPP LG) MISC 1 Units by Does not apply route daily. 1 each 0 Taking  . glucose blood (ACCU-CHEK GUIDE) test strip AM fasting and 2 hrs. After each meal 100 each 12 Taking  . Prenatal Vit-Fe Fum-Fe Bisg-FA (NATACHEW) 28-1 MG CHEW Chew 1 tablet by mouth daily. (Patient not taking: Reported on 05/23/2017) 30 tablet 12 Not Taking at Unknown time     Review of Systems   All systems reviewed and negative except as stated in HPI  Blood pressure 104/66, pulse 78, temperature 98.5 F (36.9 C), temperature source Axillary, resp. rate 16, height 5\' 3"  (1.6 m), weight 60.8 kg (134 lb), last menstrual period 09/02/2016, SpO2 98 %. General appearance: alert and cooperative Lungs: normal work of breathing Extremities: Homans sign is negative, no sign of DVT Presentation: cephalic Fetal monitoringBaseline: 140 bpm, Variability: Good {> 6 bpm) and Accelerations: Reactive Uterine activityFrequency: Every 5 minutes Dilation: 4 Effacement (%): 50 Station: -2 Exam by:: Dr Nira Retort   Prenatal labs: ABO, Rh: B/Positive/-- (03/06 1453) Antibody: Negative (03/06 1453) Rubella: immune RPR: Non Reactive (06/05 1610)  HBsAg: Negative (03/06 1453)  HIV:  NR GBS: Positive (08/13 1645)   GTT: +GDM in 3rd trimester  Prenatal Transfer Tool  Maternal Diabetes: Yes:  Diabetes Type:  Diet controlled Genetic Screening: Normal Maternal Ultrasounds/Referrals: Normal Fetal Ultrasounds or other Referrals:  Referred to Materal Fetal Medicine  Maternal Substance Abuse:  No Significant Maternal Medications:  None Significant Maternal Lab Results: None  Results for orders placed or performed during the hospital encounter of 05/23/17 (from the past 24 hour(s))  Fern Test   Collection Time: 05/23/17  5:25 PM  Result Value Ref Range   POCT Fern Test Positive = ruptured  amniotic membanes   CBC   Collection Time: 05/23/17  6:05 PM  Result Value Ref Range   WBC 8.5 4.0 - 10.5 K/uL   RBC 4.03 3.87 - 5.11 MIL/uL   Hemoglobin 11.0 (L) 12.0 - 15.0 g/dL   HCT 16.1 (L) 09.6 - 04.5 %   MCV 82.6 78.0 - 100.0 fL   MCH 27.3 26.0 - 34.0 pg   MCHC 33.0 30.0 - 36.0 g/dL   RDW 40.9 81.1 - 91.4 %   Platelets 184 150 - 400 K/uL  Glucose, capillary   Collection Time: 05/23/17  6:58 PM  Result Value Ref Range   Glucose-Capillary 96 65 - 99 mg/dL    Patient Active Problem List   Diagnosis Date Noted  . Prolonged premature rupture of membranes 05/23/2017  . GBS (group B Streptococcus carrier), +RV culture, currently pregnant 05/20/2017  . GDM (gestational diabetes mellitus) 03/11/2017  . Single umbilical artery, maternal, antepartum 01/09/2017  . Supervision of other normal pregnancy, antepartum 12/09/2016  . Previous cesarean delivery, delivered 12/09/2016    Assessment: Leslie Shaw is a 31 y.o. [id number] at [redacted]w[redacted]d here for ROM and TOLAC. She has diet controlled gestational DM and single umbilical artery complicating pregnancy.  #Labor: SOL, will augment as needed #Pain: Epidural upon request #FWB: Cat 1 tracing #ID: GBS positive- tx with PCN #MOF: breast #MOC: POP #Circ:  undecided  Dolores Patty, DO PGY-2, [facility] 05/23/2017 9:15 PM  OB FELLOW HISTORY AND PHYSICAL ATTESTATION  I have seen and examined this patient; I agree with above documentation in the resident's note.  Pitocin started for labor augmentation.  Frederik Pear, MD OB Fellow

## 2017-05-23 NOTE — Anesthesia Procedure Notes (Signed)
Epidural Patient location during procedure: OB Start time: 05/23/2017 9:50 PM End time: 05/23/2017 10:11 PM  Staffing Anesthesiologist: Jairo Ben Performed: anesthesiologist   Preanesthetic Checklist Completed: patient identified, surgical consent, pre-op evaluation, timeout performed, IV checked, risks and benefits discussed and monitors and equipment checked  Epidural Patient position: sitting Prep: site prepped and draped and DuraPrep Patient monitoring: blood pressure, continuous pulse ox and heart rate Approach: midline Location: L2-L3 Injection technique: LOR air  Needle:  Needle type: Tuohy  Needle gauge: 17 G Needle length: 9 cm Needle insertion depth: 4.5 cm Catheter type: closed end flexible Catheter size: 19 Gauge Catheter at skin depth: 10 cm Test dose: negative (1% lidocaine)  Assessment Events: blood not aspirated, injection not painful, no injection resistance, negative IV test and no paresthesia  Additional Notes Pt identified in Labor room.  Monitors applied. Working IV access confirmed. Sterile prep, drape lumbar spine.  1% lido local L 2,3.  #17ga Touhy LOR air at 4.5 cm L 2,3, cath in easily to 10 cm skin. Test dose OK, cath dosed and infusion begun.  Patient asymptomatic, VSS, no heme aspirated, tolerated well.  Sandford Craze, MDReason for block:procedure for pain

## 2017-05-23 NOTE — Anesthesia Preprocedure Evaluation (Addendum)
Anesthesia Evaluation  Patient identified by MRN, date of birth, ID band Patient awake    Reviewed: Allergy & Precautions, NPO status , Patient's Chart, lab work & pertinent test results  History of Anesthesia Complications Negative for: history of anesthetic complications  Airway Mallampati: II  TM Distance: >3 FB Neck ROM: Full    Dental  (+) Dental Advisory Given   Pulmonary neg pulmonary ROS,    breath sounds clear to auscultation       Cardiovascular negative cardio ROS   Rhythm:Regular Rate:Normal     Neuro/Psych negative neurological ROS     GI/Hepatic negative GI ROS, Neg liver ROS,   Endo/Other  diabetes (glu 96), Gestational  Renal/GU negative Renal ROS     Musculoskeletal   Abdominal   Peds  Hematology plt 184k   Anesthesia Other Findings   Reproductive/Obstetrics (+) Pregnancy                           Anesthesia Physical Anesthesia Plan  ASA: II  Anesthesia Plan: Epidural   Post-op Pain Management:    Induction:   PONV Risk Score and Plan: Treatment may vary due to age or medical condition  Airway Management Planned: Natural Airway  Additional Equipment:   Intra-op Plan:   Post-operative Plan:   Informed Consent: I have reviewed the patients History and Physical, chart, labs and discussed the procedure including the risks, benefits and alternatives for the proposed anesthesia with the patient or authorized representative who has indicated his/her understanding and acceptance.   Dental advisory given  Plan Discussed with:   Anesthesia Plan Comments: (Patient identified. Risks/Benefits/Options discussed with patient including but not limited to bleeding, infection, nerve damage, paralysis, failed block, incomplete pain control, headache, blood pressure changes, nausea, vomiting, reactions to medication both or allergic, itching and postpartum back pain.  Confirmed with bedside nurse the patient's most recent platelet count. Confirmed with patient that they are not currently taking any anticoagulation, have any bleeding history or any family history of bleeding disorders. Patient expressed understanding and wished to proceed. All questions were answered. )       Anesthesia Quick Evaluation

## 2017-05-23 NOTE — MAU Provider Note (Signed)
History     CSN: [id number]  Arrival date and time: 05/23/17 1639      Chief Complaint  Patient presents with  . Rupture of Membranes  . Decreased Fetal Movement   Peri Leslie Shaw is a 31 y.o. [id number] at [redacted]w[redacted]d presenting with vaginal leakage of fluid since midday yesterday. She first noted some thicker vaginal fluid on 05/21/2017 after having cervix examined and being told she was 4 cm. Since yesterday midday she began having spurts of thin clear fluid, enough to where of peri-pad and at times running down her leg. Today she's has noted a decrease in fetal movement. She has perceived only 1 movement prior to arrival today and 1 movement since being placed on fetal monitor. Denies bleeding or contractions. Pregnancy complicated by A1 GDM; previous C-section for breech at 32 weeks desiring TOLAC; GBS carriage; SUA fetus       OB History  Gravida Para Term Preterm AB Living  3 2 1 1   2   SAB TAB Ectopic Multiple Live Births          2    # Outcome Date GA Lbr Len/2nd Weight Sex Delivery Anes PTL Lv  3 Current           2 Preterm 12/31/07 [redacted]w[redacted]d  4 lb 13.6 oz (2.2 kg) F CS-LTranv Spinal Y LIV  1 Term 04/21/05 [redacted]w[redacted]d  5 lb 15.2 oz (2.7 kg) M Vag-Spont None N LIV    Obstetric Comments  Patient had twin pregnancy- she had C-section delivery at 34 weeks- emergent delivery water broke- week 11 they knew of 1 twin demise     History reviewed. No pertinent past medical history.  Past Surgical History:  Procedure Laterality Date  . APPENDECTOMY    . CESAREAN SECTION      History reviewed. No pertinent family history.  Social History  Substance Use Topics  . Smoking status: Never Smoker  . Smokeless tobacco: Never Used  . Alcohol use No    Allergies:  Allergies  Allergen Reactions  . Other Swelling    Tree nuts- pine cone  . Pistachio Nut (Diagnostic) Swelling    Prescriptions Prior to Admission  Medication Sig Dispense Refill Last Dose  . colchicine 0.6 MG tablet Take  0.6 mg by mouth daily.   05/22/2017 at Unknown time  . Prenatal Vit-Fe Fumarate-FA (PRENATAL MULTIVITAMIN) TABS tablet Take 1 tablet by mouth daily at 12 noon.   05/22/2017 at Unknown time  . ACCU-CHEK FASTCLIX LANCETS MISC Am Fasting and 2 Hrs. After each meal 100 each 12 Taking  . Elastic Bandages & Supports (COMFORT FIT MATERNITY SUPP LG) MISC 1 Units by Does not apply route daily. 1 each 0 Taking  . glucose blood (ACCU-CHEK GUIDE) test strip AM fasting and 2 hrs. After each meal 100 each 12 Taking  . Prenatal Vit-Fe Fum-Fe Bisg-FA (NATACHEW) 28-1 MG CHEW Chew 1 tablet by mouth daily. (Patient not taking: Reported on 05/23/2017) 30 tablet 12 Not Taking at Unknown time    Review of Systems  Constitutional: Negative for chills and fever.  Gastrointestinal: Negative for abdominal pain, constipation, diarrhea and nausea.  Genitourinary: Positive for vaginal discharge. Negative for dysuria, frequency, hematuria, urgency and vaginal bleeding.   Physical Exam   Blood pressure 105/69, pulse 95, temperature 98.5 F (36.9 C), temperature source Oral, resp. rate 17, height 5\' 3"  (1.6 m), weight 134 lb (60.8 kg), last menstrual period 09/02/2016, SpO2 98 %.  Physical Exam  Nursing note  and vitals reviewed. Constitutional: She is oriented to person, place, and time. She appears well-developed and well-nourished. No distress.  Eyes: Scleral icterus is present.  Neck: Neck supple. No thyromegaly present.  Cardiovascular: Normal rate.   Respiratory: Effort normal.  GI: Soft. There is no tenderness.  Term size  Genitourinary:  Genitourinary Comments: SSE: glisteny fluid at perineum; scant pooling; cx visually closed  Musculoskeletal: Normal range of motion.  Neurological: She is alert and oriented to person, place, and time.  Skin: Skin is warm and dry.    MAU Course  Procedures Results for orders placed or performed during the hospital encounter of 05/23/17 (from the past 24 hour(s))  Fern Test      Status: Normal   Collection Time: 05/23/17  5:25 PM  Result Value Ref Range   POCT Fern Test Positive = ruptured amniotic membanes      Assessment and Plan   1. GBS (group B Streptococcus carrier), +RV culture, currently pregnant   2. Single umbilical artery, maternal, antepartum     Leslie Shaw CNM 05/23/2017, 5:56 PM

## 2017-05-23 NOTE — Anesthesia Pain Management Evaluation Note (Signed)
  CRNA Pain Management Visit Note  Patient: Leslie Shaw, 31 y.o., female  "Hello I am a member of the anesthesia team at [facility]. We have an anesthesia team available at all times to provide care throughout the hospital, including epidural management and anesthesia for C-section. I don't know your plan for the delivery whether it a natural birth, water birth, IV sedation, nitrous supplementation, doula or epidural, but we want to meet your pain goals."   1.Was your pain managed to your expectations on prior hospitalizations?   Yes   2.What is your expectation for pain management during this hospitalization?     Labor support without medications  3.How can we help you reach that goal? Natural   Record the patient's initial score and the patient's pain goal.   Pain: 7  Pain Goal: 8 [facility] wants you to be able to say your pain was always managed very well.  Brenlynn Fake 05/23/2017

## 2017-05-23 NOTE — MAU Note (Signed)
Pt reports (per husband) that she has been leaking fluid since yesterday at 1200 noon. Also reports pain in her lower back and lower abd but pt states it isn't contractions.

## 2017-05-23 NOTE — Progress Notes (Signed)
  LABOR PROGRESS NOTE  Leslie Shaw is a 31 y.o. [id number] at [redacted]w[redacted]d  admitted for ROM at noon today. She has GDAM1 and single umbilical artery complicating pregnancy.  Subjective: She is comfortable at this time. Feels slight pressure.  Objective: BP 104/66   Pulse 78   Temp 98.5 F (36.9 C) (Axillary)   Resp 16   Ht 5\' 3"  (1.6 m)   Wt 60.8 kg (134 lb)   LMP 09/02/2016 (Exact Date)   SpO2 98%   BMI 23.74 kg/m  or  Vitals:   05/23/17 1919 05/23/17 1927 05/23/17 2032 05/23/17 2104  BP: (!) 79/64 107/71 (!) 98/57 104/66  Pulse: 79 81 83 78  Resp: 18 16 16 16   Temp:  98.5 F (36.9 C)    TempSrc:  Axillary    SpO2:      Weight:      Height:         Dilation: 4 Effacement (%): 50 Station: -2 Presentation: Vertex Exam by:: Dr Nira Retort FHT: baseline 145, accels present, no decels Uterine activity: irritable   Labs: Lab Results  Component Value Date   WBC 8.5 05/23/2017   HGB 11.0 (L) 05/23/2017   HCT 33.3 (L) 05/23/2017   MCV 82.6 05/23/2017   PLT 184 05/23/2017    Patient Active Problem List   Diagnosis Date Noted  . Prolonged premature rupture of membranes 05/23/2017  . GBS (group B Streptococcus carrier), +RV culture, currently pregnant 05/20/2017  . GDM (gestational diabetes mellitus) 03/11/2017  . Single umbilical artery, maternal, antepartum 01/09/2017  . Supervision of other normal pregnancy, antepartum 12/09/2016  . Previous cesarean delivery, delivered 12/09/2016    Assessment / Plan: 31 y.o. G3P1102 at [redacted]w[redacted]d here for ROM  Labor: progressing on pitocin Fetal Wellbeing:  Cat 1 tracing Pain Control:  Desires epidural in future Anticipated MOD:  vaginal  Tillman Sers, DO PGY-2 8/18/20189:36 PM

## 2017-05-24 ENCOUNTER — Encounter (HOSPITAL_COMMUNITY): Payer: Self-pay | Admitting: *Deleted

## 2017-05-24 LAB — RPR: RPR Ser Ql: NONREACTIVE

## 2017-05-24 LAB — CBC
HCT: 29.3 % — ABNORMAL LOW (ref 36.0–46.0)
Hemoglobin: 10 g/dL — ABNORMAL LOW (ref 12.0–15.0)
MCH: 27.9 pg (ref 26.0–34.0)
MCHC: 34.1 g/dL (ref 30.0–36.0)
MCV: 81.8 fL (ref 78.0–100.0)
PLATELETS: 161 10*3/uL (ref 150–400)
RBC: 3.58 MIL/uL — AB (ref 3.87–5.11)
RDW: 14.5 % (ref 11.5–15.5)
WBC: 13.2 10*3/uL — AB (ref 4.0–10.5)

## 2017-05-24 LAB — GLUCOSE, CAPILLARY: Glucose-Capillary: 93 mg/dL (ref 65–99)

## 2017-05-24 MED ORDER — ZOLPIDEM TARTRATE 5 MG PO TABS: 5.0000 mg | ORAL_TABLET | Freq: Every evening | ORAL | Status: DC | PRN

## 2017-05-24 MED ORDER — PRENATAL MULTIVITAMIN CH
1.0000 | ORAL_TABLET | Freq: Every day | ORAL | Status: DC
  Administered 2017-05-24: 1 via ORAL
  Filled 2017-05-24 (×2): qty 1

## 2017-05-24 MED ORDER — SIMETHICONE 80 MG PO CHEW: 80.0000 mg | CHEWABLE_TABLET | ORAL | Status: DC | PRN

## 2017-05-24 MED ORDER — WITCH HAZEL-GLYCERIN EX PADS: 1.0000 "application " | MEDICATED_PAD | CUTANEOUS | Status: DC | PRN

## 2017-05-24 MED ORDER — ACETAMINOPHEN 325 MG PO TABS
650.0000 mg | ORAL_TABLET | ORAL | Status: DC | PRN
  Administered 2017-05-24: 650 mg via ORAL
  Filled 2017-05-24: qty 2

## 2017-05-24 MED ORDER — ONDANSETRON HCL 4 MG/2ML IJ SOLN: 4.0000 mg | INTRAMUSCULAR | Status: DC | PRN

## 2017-05-24 MED ORDER — SENNOSIDES-DOCUSATE SODIUM 8.6-50 MG PO TABS
2.0000 | ORAL_TABLET | ORAL | Status: DC
  Administered 2017-05-24: 2 via ORAL
  Filled 2017-05-24: qty 2

## 2017-05-24 MED ORDER — DIBUCAINE 1 % RE OINT: 1.0000 "application " | TOPICAL_OINTMENT | RECTAL | Status: DC | PRN

## 2017-05-24 MED ORDER — TETANUS-DIPHTH-ACELL PERTUSSIS 5-2.5-18.5 LF-MCG/0.5 IM SUSP: 0.5000 mL | Freq: Once | INTRAMUSCULAR | Status: DC

## 2017-05-24 MED ORDER — DIPHENHYDRAMINE HCL 25 MG PO CAPS: 25.0000 mg | ORAL_CAPSULE | Freq: Four times a day (QID) | ORAL | Status: DC | PRN

## 2017-05-24 MED ORDER — COCONUT OIL OIL: 1.0000 "application " | TOPICAL_OIL | Status: DC | PRN

## 2017-05-24 MED ORDER — BENZOCAINE-MENTHOL 20-0.5 % EX AERO: 1.0000 "application " | INHALATION_SPRAY | CUTANEOUS | Status: DC | PRN

## 2017-05-24 MED ORDER — ONDANSETRON HCL 4 MG PO TABS: 4.0000 mg | ORAL_TABLET | ORAL | Status: DC | PRN

## 2017-05-24 NOTE — Anesthesia Postprocedure Evaluation (Signed)
Anesthesia Post Note  Patient: Janet F Brawn  Procedure(s) Performed: * No procedures listed *     Patient location during evaluation: Mother Baby Anesthesia Type: Epidural Level of consciousness: awake and alert and oriented Pain management: pain level controlled Vital Signs Assessment: post-procedure vital signs reviewed and stable Respiratory status: spontaneous breathing and nonlabored ventilation Cardiovascular status: stable Postop Assessment: no headache, patient able to bend at knees, no backache, no signs of nausea or vomiting, epidural receding and adequate PO intake Anesthetic complications: no    Last Vitals:  Vitals:   05/24/17 0604 05/24/17 1141  BP: (!) 107/58 97/60  Pulse: 71 71  Resp: 16 18  Temp: 36.6 C 36.5 C  SpO2: 99% 99%    Last Pain:  Vitals:   05/24/17 1144  TempSrc:   PainSc: 3    Pain Goal: Patients Stated Pain Goal: 3 (05/24/17 1144)               Sharlene Mccluskey Hristova

## 2017-05-24 NOTE — Lactation Note (Signed)
This note was copied from a baby's chart. Lactation Consultation Note: Infant is 26 hours old and born at 37.4 weeks. Mother has a history of preterm infants. Mother reports that she breastfed two other children for one year each. Husband at bedside to interpret as needed. Mother understands english if speak slowly.  Mother assist in hand expression and observed large drops of colostrum. Mother reports that infant is breastfeeding well. She reports that she has slight discomfort on the nipple. Infant is sleeping now and mother doesn't want to wake infant for a feeding. Advised mother to page for [facility] or staff nurse with the next feeding. Mother advised to continue to cue base feed infant and feed at least 8-12 times in 24 hours. LPI parent instructions given to  mother. Advised to hand express and offer her own milk .  Mother informed of available electric and manuel pump if she wants to pump and supplement infant with her own milk.  Mother denies having any questions or concerns. Lactation Brochure given to mother with information on available LC services, BFSG'S, OP dept. 7 day a week phone services. Mother receptive to all teaching.   Patient Name: Boy Aakriti Goans [id number] Date: 05/24/2017 Reason for consult: Initial assessment   Maternal Data    Feeding    LATCH Score                   Interventions    Lactation Tools Discussed/Used     Consult Status Consult Status: Follow-up Date: 05/25/17 Follow-up type: In-patient    Stevan Born [facility] 05/24/2017, 2:52 PM

## 2017-05-25 ENCOUNTER — Encounter: Payer: Medicaid Other | Admitting: Obstetrics and Gynecology

## 2017-05-25 LAB — GLUCOSE, CAPILLARY: GLUCOSE-CAPILLARY: 76 mg/dL (ref 65–99)

## 2017-05-25 MED ORDER — IBUPROFEN 600 MG PO TABS: 600.0000 mg | ORAL_TABLET | Freq: Four times a day (QID) | ORAL | 0 refills | Status: DC

## 2017-05-25 NOTE — Lactation Note (Signed)
This note was copied from a baby's chart. Lactation Consultation Note  Patient Name: Leslie Shaw Date: 05/25/2017 Reason for consult: Follow-up assessment   With this mom and early term baby, now 39 hours old, and 37 6/7 weeks CGA. Mom denies any questions/concerns with breast feeding. She reports sensitive nipples, but no pinching with latch. I reviewed engorgement care, cluster feeding, hand expression - mom has easily expressed colostrum, and advised mom to continue with motrin once home. I also gave mom comfort gels, and instructed her in their use and care. Mom asked about nipples creams, and I told her EBM was best, and coconut oil can be sued due to its protective quality, alternating with comfort gels. Mom has no further concerns at this time.    Maternal Data    Feeding    LATCH Score                   Interventions    Lactation Tools Discussed/Used     Consult Status Consult Status: Complete    Alfred Levins 05/25/2017, 8:53 AM

## 2017-05-25 NOTE — Discharge Summary (Signed)
       OB Discharge Summary  Patient Name: Leslie Shaw DOB: 1986-06-27 MRN: [id number]  Date of admission: 05/23/2017 Delivering MD: Marylene Land   Date of discharge: 05/25/2017  Admitting diagnosis: 37wks leaking fluid Intrauterine pregnancy: [redacted]w[redacted]d     Secondary diagnosis:Active Problems:   Prolonged premature rupture of membranes  Additional problems: + GBS     Discharge diagnosis: Preterm Pregnancy Delivered                                                                      Augmentation: Pitocin  Complications: None  Hospital course:  Onset of Labor With Vaginal Delivery     31 y.o. yo [id number] at [redacted]w[redacted]d was admitted in Latent Labor on 05/23/2017. Patient had an uncomplicated labor course as follows:  Membrane Rupture Time/Date: 12:00 PM ,05/22/2017   Intrapartum Procedures: Episiotomy: None [1]                                         Lacerations:  1st degree [2]  Patient had a delivery of a Viable infant. 05/23/2017  Information for the patient's newborn:  Aianna, Merklinger Lorren [[id number]]  Delivery Method: VBAC, Spontaneous (Filed from Delivery Summary)    Pateint had an uncomplicated postpartum course.  She is ambulating, tolerating a regular diet, passing flatus, and urinating well. Patient is discharged home in stable condition on 05/25/17.   Physical exam  Vitals:   05/24/17 0604 05/24/17 1141 05/24/17 1726 05/25/17 0619  BP: (!) 107/58 97/60 99/60  92/64  Pulse: 71 71 72 (!) 57  Resp: 16 18 18 18   Temp: 97.9 F (36.6 C) 97.7 F (36.5 C) 98.1 F (36.7 C) 98.3 F (36.8 C)  TempSrc: Oral Oral Oral Oral  SpO2: 99% 99% 100%   Weight:      Height:       General: alert Lochia: appropriate Uterine Fundus: firm Incision: N/A DVT Evaluation: No evidence of DVT seen on physical exam. Labs: Lab Results  Component Value Date   WBC 13.2 (H) 05/24/2017   HGB 10.0 (L) 05/24/2017   HCT 29.3 (L) 05/24/2017   MCV 81.8 05/24/2017   PLT 161 05/24/2017   No  flowsheet data found.  Discharge instruction: per After Visit Summary and "Baby and Me Booklet".  After Visit Meds:    Diet: routine diet  Activity: Advance as tolerated. Pelvic rest for 6 weeks.   Outpatient follow up:4 weeks Follow up Appt:Future Appointments Date Time Provider Department Center  05/25/2017 3:00 PM Constant, Peggy, MD [facility] None  05/28/2017 1:30 PM [facility] [place] 1 [facility]   Follow up visit: No Follow-up on file.  Postpartum contraception: Undecided  Newborn Data: Live born female  Birth Weight: 6 lb 12.1 oz (3065 g) APGAR: 9, 9  Baby Feeding: Breast Disposition:home with mother   05/25/2017 Allie Bossier, MD

## 2017-05-25 NOTE — Progress Notes (Signed)
Post discharge chart review completed.  

## 2017-05-25 NOTE — Discharge Instructions (Signed)
Contraception Choices °Contraception, also called birth control, means things to use or ways to try not to get pregnant. °Hormonal birth control °This kind of birth control uses hormones. Here are some types of hormonal birth control: °· A tube that is put under skin of the arm (implant). The tube can stay in for as long as 3 years. °· Shots to get every 3 months (injections). °· Pills to take every day (birth control pills). °· A patch to change 1 time each week for 3 weeks (birth control patch). After that, the patch is taken off for 1 week. °· A ring to put in the vagina. The ring is left in for 3 weeks. Then it is taken out of the vagina for 1 week. Then a new ring is put in. °· Pills to take after unprotected sex (emergency birth control pills). ° °Barrier birth control °Here are some types of barrier birth control: °· A thin covering that is put on the penis before sex (female condom). The covering is thrown away after sex. °· A soft, loose covering that is put in the vagina before sex (female condom). The covering is thrown away after sex. °· A rubber bowl that sits over the cervix (diaphragm). The bowl must be made for you. The bowl is put into the vagina before sex. The bowl is left in for 6-8 hours after sex. It is taken out within 24 hours. °· A small, soft cup that fits over the cervix (cervical cap). The cup must be made for you. The cup can be left in for 6-8 hours after sex. It is taken out within 48 hours. °· A sponge that is put into the vagina before sex. It must be left in for at least 6 hours after sex. It must be taken out within 30 hours. Then it is thrown away. °· A chemical that kills or stops sperm from getting into the uterus (spermicide). It may be a pill, cream, jelly, or foam to put in the vagina. The chemical should be used at least 10-15 minutes before sex. ° °IUD (intrauterine) birth control °An IUD is a small, T-shaped piece of plastic. It is put inside the uterus. There are two  kinds: °· Hormone IUD. This kind can stay in for 3-5 years. °· Copper IUD. This kind can stay in for 10 years. ° °Permanent birth control °Here are some types of permanent birth control: °· Surgery to block the fallopian tubes. °· Having an insert put into each fallopian tube. °· Surgery to tie off the tubes that carry sperm (vasectomy). ° °Natural planning birth control °Here are some types of natural planning birth control: °· Not having sex on the days the woman could get pregnant. °· Using a calendar: °? To keep track of the length of each period. °? To find out what days pregnancy can happen. °? To plan to not have sex on days when pregnancy can happen. °· Watching for symptoms of ovulation and not having sex during ovulation. One way the woman can check for ovulation is to check her temperature. °· Waiting to have sex until after ovulation. ° °Summary °· Contraception, also called birth control, means things to use or ways to try not to get pregnant. °· Hormonal methods of birth control include implants, injections, pills, patches, vaginal rings, and emergency birth control pills. °· Barrier methods of birth control can include female condoms, female condoms, diaphragms, cervical caps, sponges, and spermicides. °· There are two types of   IUD (intrauterine device) birth control. An IUD can be put in a woman's uterus to prevent pregnancy for 3-5 years. °· Permanent sterilization can be done through a procedure for males, females, or both. °· Natural planning methods involve not having sex on the days when the woman could get pregnant. °This information is not intended to replace advice given to you by your health care provider. Make sure you discuss any questions you have with your health care provider. °Document Released: 07/20/2009 Document Revised: 10/02/2016 Document Reviewed: 10/02/2016 °Elsevier Interactive Patient Education © 2017 [employer] °Home Care Instructions for Mom °ACTIVITY °· Gradually return to  your regular activities. °· Let yourself rest. Nap while your baby sleeps. °· Avoid lifting anything that is heavier than 10 lb (4.5 kg) until your health care provider says it is okay. °· Avoid activities that take a lot of effort and energy (are strenuous) until approved by your health care provider. Walking at a slow-to-moderate pace is usually safe. °· If you had a cesarean delivery: °? Do not vacuum, climb stairs, or drive a car for 4-6 weeks. °? Have someone help you at home until you feel like you can do your usual activities yourself. °? Do exercises as told by your health care provider, if this applies. ° °VAGINAL BLEEDING °You may continue to bleed for 4-6 weeks after delivery. Over time, the amount of blood usually decreases and the color of the blood usually gets lighter. However, the flow of bright red blood may increase if you have been too active. If you need to use more than one pad in an hour because your pad gets soaked, or if you pass a large clot: °· Lie down. °· Raise your feet. °· Place a cold compress on your lower abdomen. °· Rest. °· Call your health care provider. ° °If you are breastfeeding, your period should return anytime between 8 weeks after delivery and the time that you stop breastfeeding. If you are not breastfeeding, your period should return 6-8 weeks after delivery. °PERINEAL CARE °The perineal area, or perineum, is the part of your body between your thighs. After delivery, this area needs special care. Follow these instructions as told by your health care provider. °· Take warm tub baths for 15-20 minutes. °· Use medicated pads and pain-relieving sprays and creams as told. °· Do not use tampons or douches until vaginal bleeding has stopped. °· Each time you go to the bathroom: °? Use a peri bottle. °? Change your pad. °? Use towelettes in place of toilet paper until your stitches have healed. °· Do Kegel exercises every day. Kegel exercises help to maintain the muscles that  support the vagina, bladder, and bowels. You can do these exercises while you are standing, sitting, or lying down. To do Kegel exercises: °? Tighten the muscles of your abdomen and the muscles that surround your birth canal. °? Hold for a few seconds. °? Relax. °? Repeat until you have done this 5 times in a row. °· To prevent hemorrhoids from developing or getting worse: °? Drink enough fluid to keep your urine clear or pale yellow. °? Avoid straining when having a bowel movement. °? Take over-the-counter medicines and stool softeners as told by your health care provider. ° °BREAST CARE °· Wear a tight-fitting bra. °· Avoid taking over-the-counter pain medicine for breast discomfort. °· Apply ice to the breasts to help with discomfort as needed: °? Put ice in a plastic bag. °? Place a towel between your   skin and the bag. °? Leave the ice on for 20 minutes or as told by your health care provider. ° °NUTRITION °· Eat a well-balanced diet. °· Do not try to lose weight quickly by cutting back on calories. °· Take your prenatal vitamins until your postpartum checkup or until your health care provider tells you to stop. ° °POSTPARTUM DEPRESSION °You may find yourself crying for no apparent reason and unable to cope with all of the changes that come with having a newborn. This mood is called postpartum depression. Postpartum depression happens because your hormone levels change after delivery. If you have postpartum depression, get support from your partner, friends, and family. If the depression does not go away on its own after several weeks, contact your health care provider. °BREAST SELF-EXAM °Do a breast self-exam each month, at the same time of the month. If you are breastfeeding, check your breasts just after a feeding, when your breasts are less full. If you are breastfeeding and your period has started, check your breasts on day 5, 6, or 7 of your period. °Report any lumps, bumps, or discharge to your health  care provider. Know that breasts are normally lumpy if you are breastfeeding. This is temporary, and it is not a health risk. °INTIMACY AND SEXUALITY °Avoid sexual activity for at least 3-4 weeks after delivery or until the brownish-red vaginal flow is completely gone. If you want to avoid pregnancy, use some form of birth control. You can get pregnant after delivery, even if you have not had your period. °SEEK MEDICAL CARE IF: °· You feel unable to cope with the changes that a child brings to your life, and these feelings do not go away after several weeks. °· You notice a lump, a bump, or discharge on your breast. ° °SEEK IMMEDIATE MEDICAL CARE IF: °· Blood soaks your pad in 1 hour or less. °· You have: °? Severe pain or cramping in your lower abdomen. °? A bad-smelling vaginal discharge. °? A fever that is not controlled by medicine. °? A fever, and an area of your breast is red and sore. °? Pain or redness in your calf. °? Sudden, severe chest pain. °? Shortness of breath. °? Painful or bloody urination. °? Problems with your vision. °· You vomit for 12 hours or longer. °· You develop a severe headache. °· You have serious thoughts about hurting yourself, your child, or anyone else. ° °This information is not intended to replace advice given to you by your health care provider. Make sure you discuss any questions you have with your health care provider. °Document Released: 09/19/2000 Document Revised: 02/28/2016 Document Reviewed: 03/26/2015 °Elsevier Interactive Patient Education © 2017 [employer] ° °

## 2017-05-28 ENCOUNTER — Ambulatory Visit (HOSPITAL_COMMUNITY): Payer: Medicaid Other

## 2017-05-28 ENCOUNTER — Encounter (HOSPITAL_COMMUNITY): Payer: Self-pay

## 2017-06-25 ENCOUNTER — Encounter: Payer: Self-pay | Admitting: Certified Nurse Midwife

## 2017-06-25 ENCOUNTER — Ambulatory Visit (INDEPENDENT_AMBULATORY_CARE_PROVIDER_SITE_OTHER): Payer: Medicaid Other | Admitting: Certified Nurse Midwife

## 2017-06-25 DIAGNOSIS — Z1389 Encounter for screening for other disorder: Secondary | ICD-10-CM | POA: Diagnosis not present

## 2017-06-25 DIAGNOSIS — Z3202 Encounter for pregnancy test, result negative: Secondary | ICD-10-CM | POA: Diagnosis not present

## 2017-06-25 DIAGNOSIS — Z30011 Encounter for initial prescription of contraceptive pills: Secondary | ICD-10-CM

## 2017-06-25 LAB — POCT URINE PREGNANCY: PREG TEST UR: NEGATIVE

## 2017-06-25 MED ORDER — NORETHINDRONE 0.35 MG PO TABS: 1.0000 | ORAL_TABLET | Freq: Every day | ORAL | 11 refills | Status: DC

## 2017-06-25 NOTE — Progress Notes (Signed)
Patient wants NuvaRing for [facility]. UPT today is NEGATIVE.

## 2017-06-25 NOTE — Progress Notes (Signed)
   Subjective:     Leslie Shaw is a 31 y.o. female who presents for a postpartum visit. She is 4 weeks postpartum following a spontaneous vaginal delivery. I have fully reviewed the prenatal and intrapartum course. The delivery was at 37.3 gestational weeks. Outcome: spontaneous vaginal delivery. Anesthesia: epidural. Postpartum course has been GOOD. Baby's course has been GOOD. Baby is feeding by breast. Bleeding no bleeding. Bowel function is normal. Bladder function is normal. Patient is not sexually active. Contraception method is none. Postpartum depression screening: negative.  The following portions of the patient's history were reviewed and updated as appropriate: allergies, current medications, past family history, past medical history, past social history, past surgical history and problem list.  Review of Systems Pertinent items noted in HPI and remainder of comprehensive ROS otherwise negative.   Objective:    BP 97/69   Pulse 74   Ht  (1.6 m)   Wt 114 lb 8 oz (51.9 kg)   Breastfeeding? Yes   BMI 20.28 kg/m   General:  alert, cooperative and no distress   Breasts:  inspection negative, no nipple discharge or bleeding, no masses or nodularity palpable  Lungs: clear to auscultation bilaterally  Heart:  regular rate and rhythm, S1, S2 normal, no murmur, click, rub or gallop  Abdomen: soft, non-tender; bowel sounds normal; no masses,  no organomegaly  Pelvic Exam: Not performed.        Assessment:    Normal 4 week postpartum exam. Pap smear not done at today's visit.  Breast feeding status  Micronor started for birth control  Plan:    1. Contraception: abstinence 2. Starting POP 3. Follow up in: 2 weeks annual exam or as needed.

## 2017-06-29 ENCOUNTER — Encounter: Payer: Self-pay | Admitting: Certified Nurse Midwife

## 2017-07-09 ENCOUNTER — Ambulatory Visit: Payer: Medicaid Other | Admitting: Certified Nurse Midwife

## 2017-08-31 ENCOUNTER — Ambulatory Visit (INDEPENDENT_AMBULATORY_CARE_PROVIDER_SITE_OTHER): Payer: Medicaid Other | Admitting: Certified Nurse Midwife

## 2017-08-31 ENCOUNTER — Encounter: Payer: Self-pay | Admitting: Certified Nurse Midwife

## 2017-08-31 DIAGNOSIS — Z3202 Encounter for pregnancy test, result negative: Secondary | ICD-10-CM | POA: Diagnosis not present

## 2017-08-31 DIAGNOSIS — Z30011 Encounter for initial prescription of contraceptive pills: Secondary | ICD-10-CM

## 2017-08-31 DIAGNOSIS — Z309 Encounter for contraceptive management, unspecified: Secondary | ICD-10-CM | POA: Diagnosis not present

## 2017-08-31 LAB — POCT URINE PREGNANCY: PREG TEST UR: NEGATIVE

## 2017-08-31 MED ORDER — NORETHINDRONE 0.35 MG PO TABS: 1.0000 | ORAL_TABLET | Freq: Every day | ORAL | 11 refills | Status: DC

## 2017-08-31 NOTE — Addendum Note (Signed)
Addended by: Dalphine HandingGARDNER, Ajai Terhaar L on: 08/31/2017 04:57 PM   Modules accepted: Orders

## 2017-08-31 NOTE — Progress Notes (Signed)
Subjective:    Leslie Shaw is a 31 y.o. female who presents for contraception counseling. The patient has no complaints today. The patient is sexually active. Pertinent past medical history: none.  Reviewed all forms of birth control options available including abstinence; over the counter/barrier methods; hormonal contraceptive medication including pill, patch, ring, injection,contraceptive implant; hormonal and nonhormonal IUDs; permanent sterilization options including vasectomy and the various tubal sterilization modalities. Risks and benefits reviewed.  Questions were answered.  Information was given to patient to review.   The information documented in the HPI was reviewed and verified.  Menstrual History: OB History    Gravida Para Term Preterm AB Living   3 3 2 1   3    SAB TAB Ectopic Multiple Live Births         0 3      Obstetric Comments   Patient had twin pregnancy- she had C-section delivery at 34 weeks- emergent delivery water broke- week 11 they knew of 1 twin demise       Patient's last menstrual period was 07/03/2017 (exact date).   Patient Active Problem List   Diagnosis Date Noted  . GDM (gestational diabetes mellitus) 03/11/2017   History reviewed. No pertinent past medical history.  Past Surgical History:  Procedure Laterality Date  . APPENDECTOMY    . CESAREAN SECTION       Current Outpatient Medications:  .  colchicine 0.6 MG tablet, Take 0.6 mg by mouth daily., Disp: , Rfl:  .  ibuprofen (ADVIL,MOTRIN) 600 MG tablet, Take 1 tablet (600 mg total) by mouth every 6 (six) hours., Disp: 30 tablet, Rfl: 0 .  Prenatal Vit-Fe Fumarate-FA (PRENATAL MULTIVITAMIN) TABS tablet, Take 1 tablet by mouth daily at 12 noon., Disp: , Rfl:  .  norethindrone (MICRONOR,CAMILA,ERRIN) 0.35 MG tablet, Take 1 tablet (0.35 mg total) by mouth daily., Disp: 1 Package, Rfl: 11 Allergies  Allergen Reactions  . Other Swelling    Tree nuts- pine cone  . Pistachio Nut (Diagnostic)  Swelling  . Shrimp [Shellfish Allergy] Swelling    Social History   Tobacco Use  . Smoking status: Never Smoker  . Smokeless tobacco: Never Used  Substance Use Topics  . Alcohol use: No    History reviewed. No pertinent family history.     Review of Systems Constitutional: negative for weight loss Genitourinary:negative for abnormal menstrual periods and vaginal discharge   Objective:   BP 96/72   Pulse 79   Wt 114 lb 6.4 oz (51.9 kg)   LMP 07/03/2017 (Exact Date)   BMI 20.27 kg/m    General:   alert  Deferred  Lab Review Urine pregnancy test Labs reviewed yes Radiologic studies reviewed no  90% of 15 min visit spent on counseling and coordination of care.    Assessment:    31 y.o., starting oral progesterone-only contraceptive, no contraindications.   Hx of GDM  Plan:    2 hour OGTT:  Needed ASAP  All questions answered.  Meds ordered this encounter  Medications  . norethindrone (MICRONOR,CAMILA,ERRIN) 0.35 MG tablet    Sig: Take 1 tablet (0.35 mg total) by mouth daily.    Dispense:  1 Package    Refill:  11   No orders of the defined types were placed in this encounter.   Follow up as needed.

## 2017-09-08 ENCOUNTER — Other Ambulatory Visit: Payer: Medicaid Other

## 2017-09-08 DIAGNOSIS — Z8632 Personal history of gestational diabetes: Secondary | ICD-10-CM

## 2017-09-09 LAB — GLUCOSE TOLERANCE, 2 HOURS W/ 1HR
GLUCOSE, 1 HOUR: 145 mg/dL (ref 65–179)
GLUCOSE, FASTING: 71 mg/dL (ref 65–91)
Glucose, 2 hour: 89 mg/dL (ref 65–152)

## 2017-09-11 ENCOUNTER — Other Ambulatory Visit: Payer: Self-pay | Admitting: Certified Nurse Midwife

## 2017-10-19 ENCOUNTER — Ambulatory Visit (INDEPENDENT_AMBULATORY_CARE_PROVIDER_SITE_OTHER): Payer: Medicaid Other | Admitting: Certified Nurse Midwife

## 2017-10-19 ENCOUNTER — Encounter: Payer: Self-pay | Admitting: Certified Nurse Midwife

## 2017-10-19 DIAGNOSIS — Z3480 Encounter for supervision of other normal pregnancy, unspecified trimester: Secondary | ICD-10-CM | POA: Diagnosis not present

## 2017-10-19 MED ORDER — NATACHEW 28-1 MG PO CHEW: 1.0000 | CHEWABLE_TABLET | Freq: Every day | ORAL | 12 refills | Status: DC

## 2017-10-19 NOTE — Progress Notes (Signed)
Patient ID: Leslie Shaw, female   DOB: 11/18/1985, 32 y.o.   MRN: [id number]  Chief Complaint  Patient presents with  . Breast Pain    left x 7-10 days, breast feeding    HPI Leslie Shaw is a 32 y.o. female.  Here for left breast pain towards the sternal boarder for 7-10 days.  Is breastfeeding.  Denies fever or flu like symptoms.   HPI  History reviewed. No pertinent past medical history.  Past Surgical History:  Procedure Laterality Date  . APPENDECTOMY    . CESAREAN SECTION      History reviewed. No pertinent family history.  Social History Social History   Tobacco Use  . Smoking status: Never Smoker  . Smokeless tobacco: Never Used  Substance Use Topics  . Alcohol use: No  . Drug use: No    Allergies  Allergen Reactions  . Other Swelling    Tree nuts- pine cone  . Pistachio Nut (Diagnostic) Swelling  . Shrimp [Shellfish Allergy] Swelling    Current Outpatient Medications  Medication Sig Dispense Refill  . colchicine 0.6 MG tablet Take 0.6 mg by mouth daily.    Marland Kitchen. ibuprofen (ADVIL,MOTRIN) 600 MG tablet Take 1 tablet (600 mg total) by mouth every 6 (six) hours. 30 tablet 0  . norethindrone (MICRONOR,CAMILA,ERRIN) 0.35 MG tablet Take 1 tablet (0.35 mg total) by mouth daily. 1 Package 11  . Prenatal Vit-Fe Fumarate-FA (PRENATAL MULTIVITAMIN) TABS tablet Take 1 tablet by mouth daily at 12 noon.    . Prenatal Vit-Fe Fum-Fe Bisg-FA (NATACHEW) 28-1 MG CHEW Chew 1 tablet by mouth daily. 30 tablet 12   No current facility-administered medications for this visit.     Review of Systems Review of Systems Constitutional: negative for fatigue and weight loss Respiratory: negative for cough and wheezing Cardiovascular: negative for chest pain, fatigue and palpitations Gastrointestinal: negative for abdominal pain and change in bowel habits Genitourinary:negative Integument/breast: negative for nipple discharge Musculoskeletal:negative for myalgias Neurological: negative  for gait problems and tremors Behavioral/Psych: negative for abusive relationship, depression Endocrine: negative for temperature intolerance      Blood pressure 106/71, pulse 88, weight 116 lb 8 oz (52.8 kg), currently breastfeeding.  Physical Exam Physical Exam General:   alert  Skin:   no rash or abnormalities  Lungs:   clear to auscultation bilaterally  Heart:   regular rate and rhythm, S1, S2 normal, no murmur, click, rub or gallop  Breasts:   normal without suspicious masses, skin or nipple changes or axillary nodes  Abdomen:  normal findings: no organomegaly, soft, non-tender and no hernia  Pelvis:  deferred    50% of 15 min visit spent on counseling and coordination of care.   Data Reviewed Previous medical hx, meds, labs  Assessment     Left breast plugged duct     Plan     encouragement given  Meds ordered this encounter  Medications  . Prenatal Vit-Fe Fum-Fe Bisg-FA (NATACHEW) 28-1 MG CHEW    Sig: Chew 1 tablet by mouth daily.    Dispense:  30 tablet    Refill:  12     Possible management options include: F/U in 10-14 days.  If mastitis or unresolved breast US and antibiotic therapy as indicated.   Follow up as needed if resolves with massage and hot packs to left breast duct.

## 2017-10-21 ENCOUNTER — Telehealth: Payer: Self-pay | Admitting: Pediatrics

## 2017-10-21 NOTE — Telephone Encounter (Signed)
Natachew not covered on [facility].

## 2017-10-26 ENCOUNTER — Ambulatory Visit: Payer: Medicaid Other | Admitting: Certified Nurse Midwife

## 2017-10-27 ENCOUNTER — Ambulatory Visit: Payer: Medicaid Other | Admitting: Certified Nurse Midwife

## 2017-11-30 ENCOUNTER — Ambulatory Visit (INDEPENDENT_AMBULATORY_CARE_PROVIDER_SITE_OTHER): Payer: Medicaid Other | Admitting: Certified Nurse Midwife

## 2017-11-30 ENCOUNTER — Encounter: Payer: Self-pay | Admitting: Certified Nurse Midwife

## 2017-11-30 VITALS — BP 106/72 | HR 88 | Wt 109.6 lb

## 2017-11-30 DIAGNOSIS — Z30017 Encounter for initial prescription of implantable subdermal contraceptive: Secondary | ICD-10-CM

## 2017-11-30 DIAGNOSIS — Z3202 Encounter for pregnancy test, result negative: Secondary | ICD-10-CM

## 2017-11-30 DIAGNOSIS — Z3049 Encounter for surveillance of other contraceptives: Secondary | ICD-10-CM | POA: Diagnosis not present

## 2017-11-30 LAB — POCT URINE PREGNANCY: Preg Test, Ur: NEGATIVE

## 2017-11-30 MED ORDER — ETONOGESTREL 68 MG ~~LOC~~ IMPL
68.0000 mg | DRUG_IMPLANT | Freq: Once | SUBCUTANEOUS | Status: AC
  Administered 2017-11-30: 68 mg via SUBCUTANEOUS

## 2017-11-30 NOTE — Progress Notes (Signed)
Nexplanon Procedure Note   PRE-OP DIAGNOSIS: desired long-term, reversible contraception  POST-OP DIAGNOSIS: Same  PROCEDURE: Nexplanon  placement Performing Provider: Orvilla Cornwallachelle Harveer Sadler CNM   Patient education prior to procedure, explained risk, benefits of Nexplanon, reviewed alternative options. Patient reported understanding. Gave consent to continue with procedure.   PROCEDURE:  Pregnancy Text :  Negative Site (check):      left arm         Sterile Preparation:   Hibiclens-Alcoholx3 Lot # [id number]  [id number] Expiration Date May 18, 2020  Insertion site was selected 8 - 10 cm from medial epicondyle and marked along with guiding site using sterile marker. Procedure area was prepped and draped in a sterile fashion. 1% Lidocaine 1.5 ml given prior to procedure. Nexplanon  was inserted subcutaneously.Needle was removed from the insertion site. Nexplanon capsule was palpated by provider and patient to assure satisfactory placement. And a bandage applied and the arm was wrapped with gauze bandage.     Followup: The patient tolerated the procedure well without complications.  Instructions:  The patient was instructed to remove the dressing in 24 hours and that some bruising is to be expected.  She was advised to use over the counter analgesics as needed for any pain at the site.  She is to keep the area dry for 24 hours and to call if her hand or arm becomes cold, numb, or blue.   Orvilla Cornwallachelle Imani Sherrin CNM

## 2017-11-30 NOTE — Progress Notes (Signed)
Pt here to change contraception. She is currently taking the pill, but states that she has not taken them for the last 3 days.

## 2018-01-31 ENCOUNTER — Other Ambulatory Visit: Payer: Self-pay | Admitting: Certified Nurse Midwife

## 2018-02-02 NOTE — Telephone Encounter (Signed)
This Rx request should go to [facility].

## 2018-02-06 ENCOUNTER — Encounter: Payer: Self-pay | Admitting: Certified Nurse Midwife

## 2018-02-08 ENCOUNTER — Other Ambulatory Visit: Payer: Self-pay | Admitting: Certified Nurse Midwife

## 2018-02-08 ENCOUNTER — Other Ambulatory Visit: Payer: Self-pay | Admitting: *Deleted

## 2018-02-08 DIAGNOSIS — Z3480 Encounter for supervision of other normal pregnancy, unspecified trimester: Secondary | ICD-10-CM

## 2018-02-08 MED ORDER — PROVIDA DHA 16-16-1.25-110 MG PO CAPS: 1.0000 | ORAL_CAPSULE | Freq: Every day | ORAL | 12 refills | Status: DC

## 2018-03-23 ENCOUNTER — Encounter: Payer: Self-pay | Admitting: Certified Nurse Midwife

## 2018-03-23 DIAGNOSIS — Z3480 Encounter for supervision of other normal pregnancy, unspecified trimester: Secondary | ICD-10-CM

## 2018-07-02 ENCOUNTER — Ambulatory Visit: Payer: Medicaid Other | Admitting: Obstetrics & Gynecology

## 2018-07-02 ENCOUNTER — Other Ambulatory Visit (HOSPITAL_COMMUNITY)
Admission: RE | Admit: 2018-07-02 | Discharge: 2018-07-02 | Disposition: A | Discharge status: Home / Self Care | Payer: Medicaid Other | Source: Ambulatory Visit | Attending: Obstetrics & Gynecology | Admitting: Obstetrics & Gynecology

## 2018-07-02 VITALS — BP 99/68 | HR 105 | Wt 111.0 lb

## 2018-07-02 DIAGNOSIS — Z0000 Encounter for general adult medical examination without abnormal findings: Secondary | ICD-10-CM | POA: Insufficient documentation

## 2018-07-02 DIAGNOSIS — Z23 Encounter for immunization: Secondary | ICD-10-CM

## 2018-07-02 DIAGNOSIS — N926 Irregular menstruation, unspecified: Secondary | ICD-10-CM

## 2018-07-02 MED ORDER — NORGESTREL-ETHINYL ESTRADIOL 0.3-30 MG-MCG PO TABS: 1.0000 | ORAL_TABLET | Freq: Every day | ORAL | 11 refills | Status: DC

## 2018-07-02 NOTE — Progress Notes (Signed)
Patient ID: Leslie Shaw, female   DOB: 03-18-1986, 32 y.o.   MRN: [id number]  Chief Complaint  Patient presents with  . Contraception    bleeding with Nexplanon-  Pt states she has been bleeding daily since Aug 12th- heavy bleeding    HPI Leslie Shaw is a 32 y.o. female. P3 who is here with irregular bleeding since 8/19. She had a Nexplanon placed 3/19. She has not found anything that makes the bleeding better or worse.  HPI  No past medical history on file.  Past Surgical History:  Procedure Laterality Date  . APPENDECTOMY    . CESAREAN SECTION      No family history on file.  Social History Social History   Tobacco Use  . Smoking status: Never Smoker  . Smokeless tobacco: Never Used  Substance Use Topics  . Alcohol use: No  . Drug use: No    Allergies  Allergen Reactions  . Other Swelling    Tree nuts- pine cone  . Pistachio Nut (Diagnostic) Swelling  . Shrimp [Shellfish Allergy] Swelling    Current Outpatient Medications  Medication Sig Dispense Refill  . colchicine 0.6 MG tablet Take 0.6 mg by mouth daily.    Marland Kitchen ibuprofen (ADVIL,MOTRIN) 600 MG tablet Take 1 tablet (600 mg total) by mouth every 6 (six) hours. 30 tablet 0  . norethindrone (MICRONOR,CAMILA,ERRIN) 0.35 MG tablet Take 1 tablet (0.35 mg total) by mouth daily. 1 Package 11  . Prenat-FeFum-FePo-FA-DHA w/o A (PROVIDA DHA) 16-16-1.25-110 MG CAPS TAKE ONE CAPSULE BY MOUTH AT BEDTIME 30 capsule 11  . Prenat-FeFum-FePo-FA-DHA w/o A (PROVIDA DHA) 16-16-1.25-110 MG CAPS Take 1 tablet by mouth at bedtime. 30 capsule 12  . Prenatal Vit-Fe Fumarate-FA (PRENATAL MULTIVITAMIN) TABS tablet Take 1 tablet by mouth daily at 12 noon.     No current facility-administered medications for this visit.     Review of Systems Review of Systems  She is breast feeding her 1 year son.  Blood pressure 99/68, pulse (!) 105, weight 111 lb (50.3 kg), currently breastfeeding.  Physical Exam Physical Exam  Breathing, conversing,  and ambulating normally Well nourished, well hydrated Asian female, no apparent distress Abd- benign   Data Reviewed   Assessment   irregular bleeding with Nexplanon     Plan   trial of OCPs with bleeding prn If no better in a month, then offer Nexplanon removal Flu vaccine today Pap smear done today        Jerimie Mancuso C Zamzam Whinery 07/02/2018, 11:17 AM

## 2018-07-06 ENCOUNTER — Ambulatory Visit: Payer: Medicaid Other | Admitting: Obstetrics & Gynecology

## 2018-07-07 LAB — CYTOLOGY - PAP
DIAGNOSIS: NEGATIVE
HPV (WINDOPATH): DETECTED — AB

## 2019-01-14 IMAGING — US US MFM OB COMP +14 WKS
1 series · 14 of 28 positions shown · non-contrast
Comparison: none

[Series 1: us mfm ob comp +14 wks · 50 acquisitions, 14 frames shown]
[im 2/50]
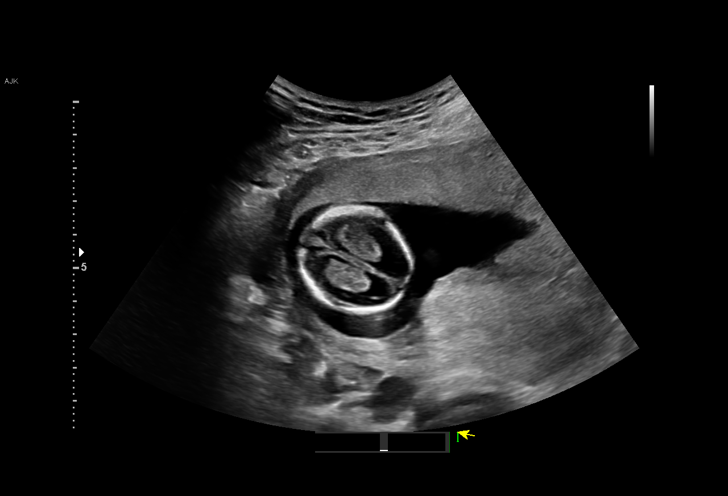
[im 6/50]
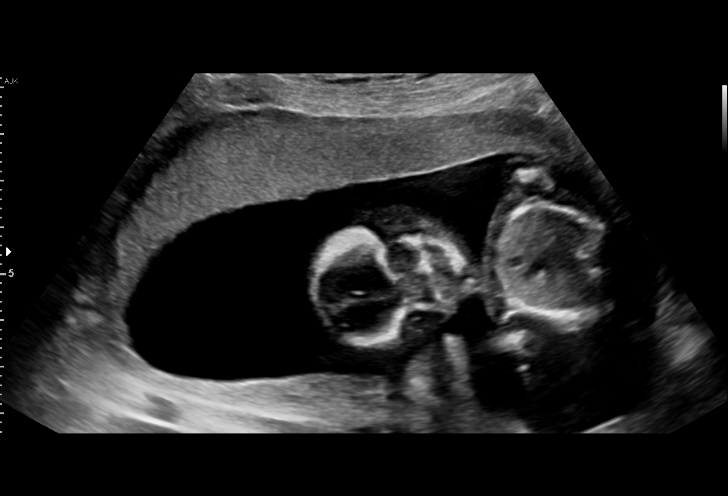
[im 10/50]
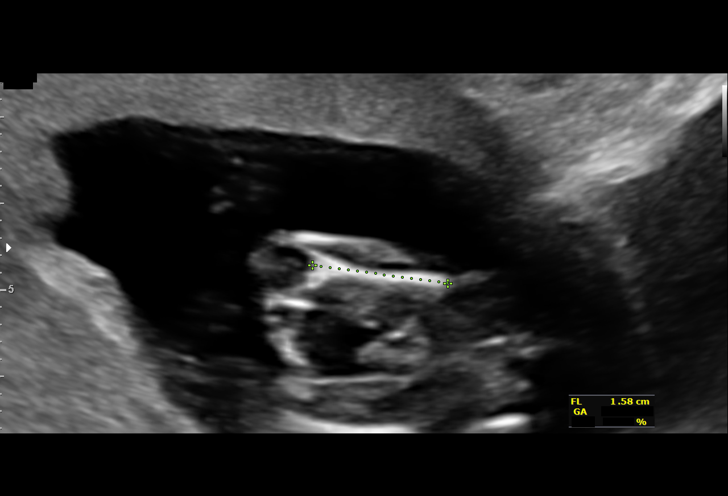
[im 13/50]
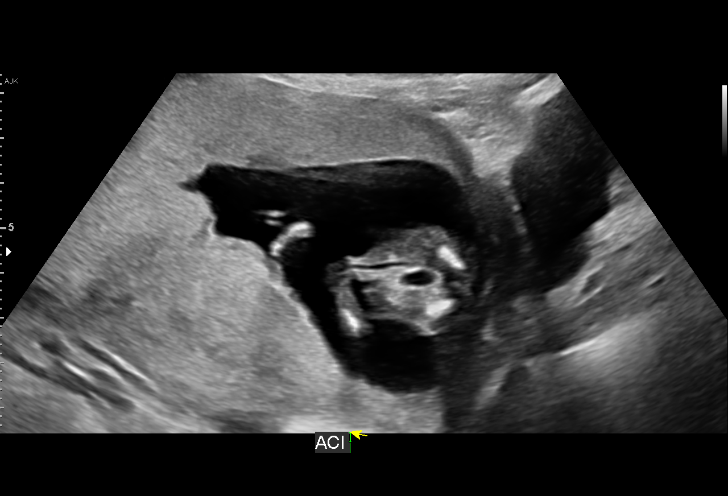
[im 17/50]
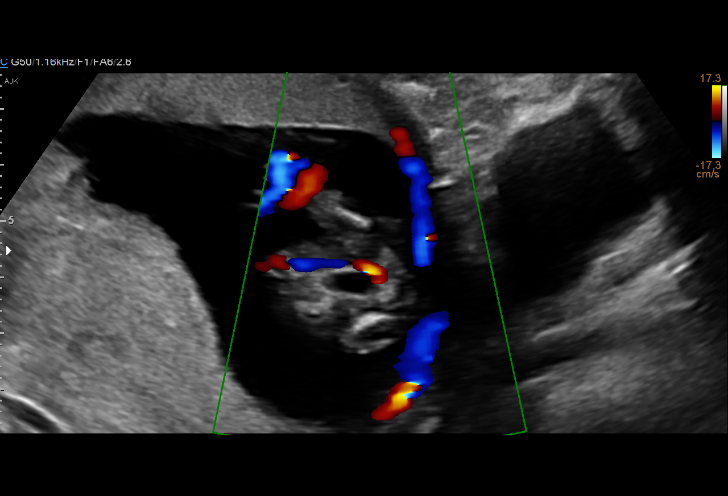
[im 20/50]
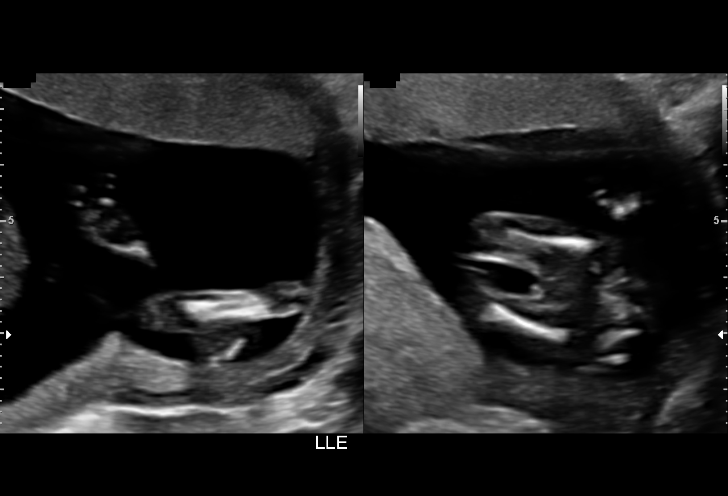
[im 24/50]
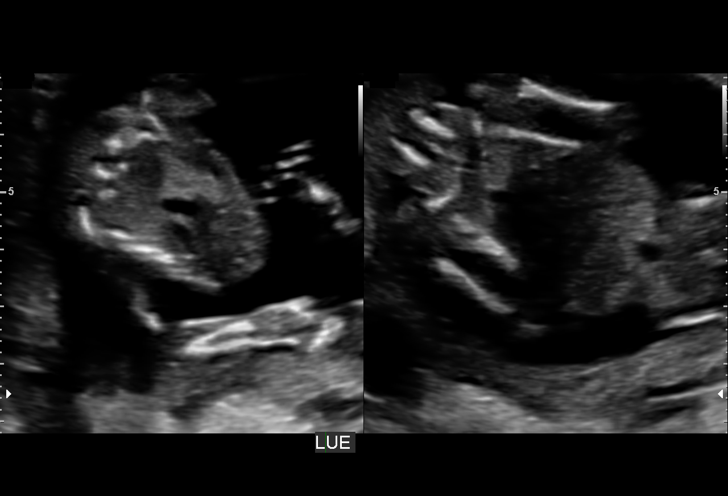
[im 28/50]
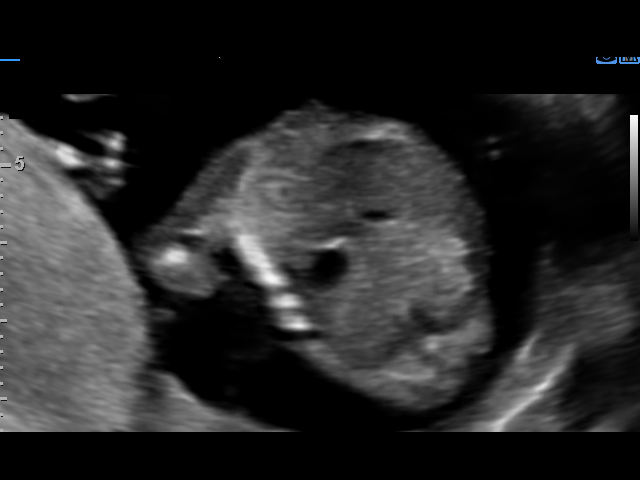
[im 31/50]
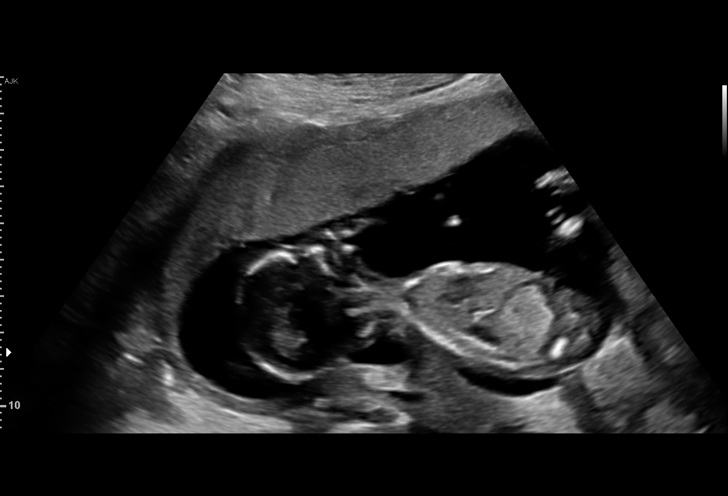
[im 35/50]
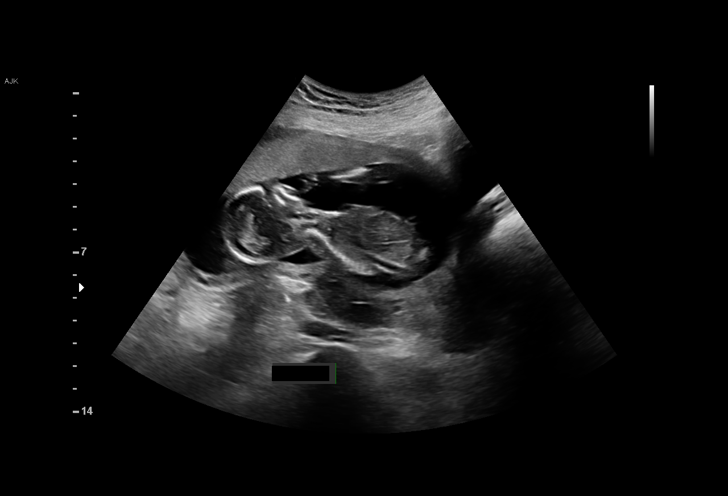
[im 39/50]
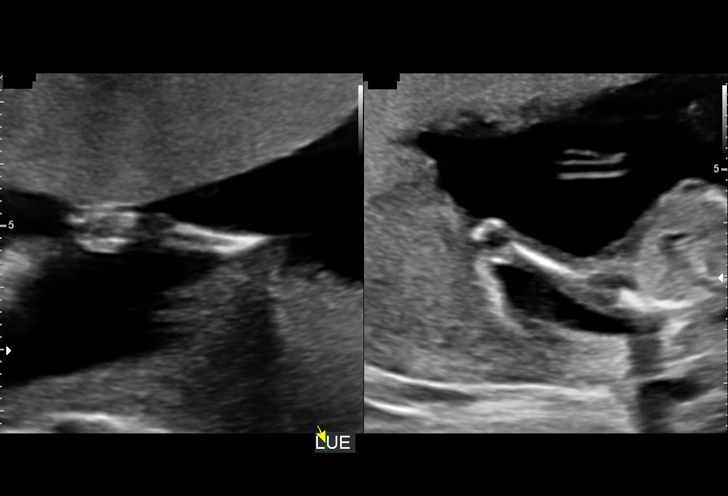
[im 42/50]
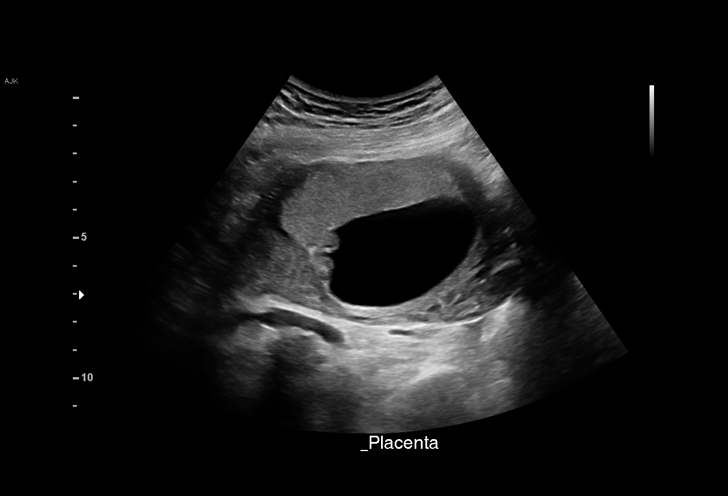
[im 46/50]
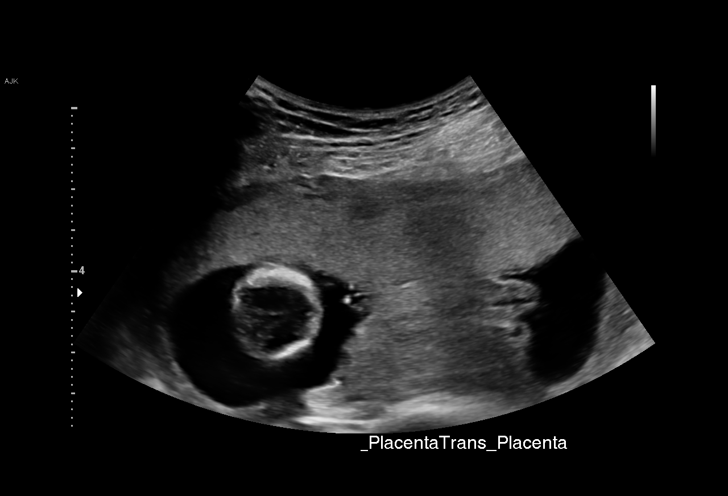
[im 50/50]
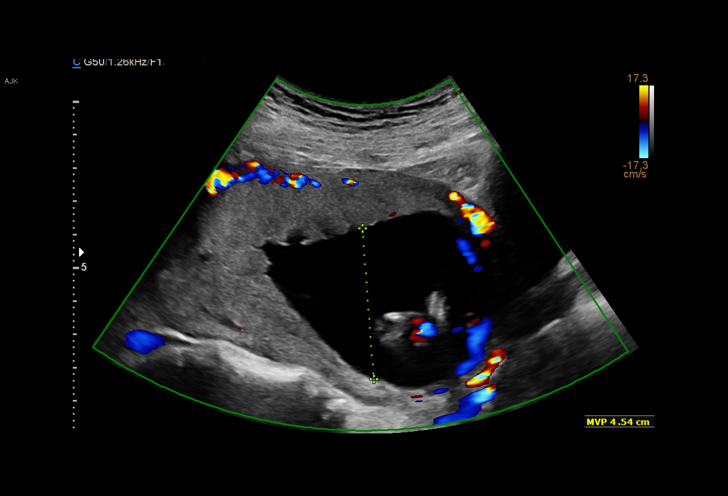

[14 of 28 positions shown; findings below may reference images not displayed]

Road [HOSPITAL]

Indications

15 weeks gestation of pregnancy
Encounter for fetal anatomic survey
Poor obstetric history: Previous preterm
delivery, antepartum (34 weeks)
History of cesarean delivery, currently
pregnant
OB History

Gravidity:    3         Term:   1        Prem:   1        SAB:   0
TOP:          0       Ectopic:  0        Living: 2
Fetal Evaluation

Num Of Fetuses:     1
Fetal Heart         159
Rate(bpm):
Cardiac Activity:   Observed
Presentation:       Breech
Placenta:           Anterior, above cervical os
P. Cord Insertion:  Visualized, central

Amniotic Fluid
AFI FV:      Subjectively within normal limits

Largest Pocket(cm)
4.54
Biometry

CRL:      94.7  mm     G. Age:  N/A                     EDD:
BPD:      32.4  mm     G. Age:  16w 1d         89  %    CI:         80.7   %    70 - 86
FL/HC:      13.3   %    15.3 -
HC:      113.9  mm     G. Age:  15w 4d         62  %    HC/AC:      1.14        1.05 -
AC:      100.3  mm     G. Age:  16w 0d         85  %    FL/BPD:     46.9   %
FL:       15.2  mm     G. Age:  14w 3d         24  %    FL/AC:      15.2   %    20 - 24

Est. FW:     121  gm      0 lb 4 oz   > 75  %
Gestational Age

LMP:           15w 0d        Date:  09/02/16                 EDD:   06/09/17
U/S Today:     15w 4d                                        EDD:   06/05/17
Best:          15w 0d     Det. By:  LMP  (09/02/16)          EDD:   06/09/17
Anatomy

Cranium:               Appears normal         Abdominal Wall:         Appears nml (cord
insert, abd wall)
Choroid Plexus:        Appears normal         Cord Vessels:           2 Vessel Cord
Face:                  Orbits nl; profile not Kidneys:                Not well visualized
well visualized
Diaphragm:             Appears normal         Bladder:                Appears normal
Stomach:               Appears normal, left   Upper Extremities:      Appears normal
sided
Abdomen:               Appears normal         Lower Extremities:      Appears normal

Other:  Fetus appears to be a male. Technically difficult due to early
gestational age.
Cervix Uterus Adnexa

Cervix
Not adaquately visualized

Uterus
No abnormality visualized.

Left Ovary
Not visualized.

Right Ovary
Not visualized.

Adnexa:       No abnormality visualized.
Impression

Singleton intrauterine pregnancy at 15+0 weeks, for anatomic
survey
Review of the anatomy shows no sonographic markers for
aneuploidy or structural anomalies
However, this evaluation should be considered quite
suboptimal secondary to the early gestational age, especially
in the cardiac anatomy
Amniotic fluid volume is normal
Estimated fetal weight is 121g which is growth in >75th
percentile
Recommendations

Recommend repeat scan in 4 weeks to complete anatomic
survey

## 2019-01-28 ENCOUNTER — Encounter: Payer: Self-pay | Admitting: *Deleted

## 2019-03-30 ENCOUNTER — Ambulatory Visit: Payer: Medicaid Other | Admitting: Certified Nurse Midwife

## 2019-04-06 ENCOUNTER — Ambulatory Visit (INDEPENDENT_AMBULATORY_CARE_PROVIDER_SITE_OTHER): Payer: Medicaid Other | Admitting: Certified Nurse Midwife

## 2019-04-06 ENCOUNTER — Other Ambulatory Visit: Payer: Self-pay

## 2019-04-06 ENCOUNTER — Encounter: Payer: Self-pay | Admitting: Certified Nurse Midwife

## 2019-04-06 VITALS — BP 95/62 | HR 79 | Temp 98.2°F | Wt 116.0 lb

## 2019-04-06 DIAGNOSIS — Z30018 Encounter for initial prescription of other contraceptives: Secondary | ICD-10-CM

## 2019-04-06 DIAGNOSIS — Z3046 Encounter for surveillance of implantable subdermal contraceptive: Secondary | ICD-10-CM

## 2019-04-06 MED ORDER — ETONOGESTREL-ETHINYL ESTRADIOL 0.12-0.015 MG/24HR VA RING: VAGINAL_RING | VAGINAL | 4 refills | Status: DC

## 2019-04-06 NOTE — Patient Instructions (Signed)
Ethinyl Estradiol; Etonogestrel vaginal ring What is this medicine? ETHINYL ESTRADIOL; ETONOGESTREL (ETH in il es tra DYE ole; et oh noe JES trel) vaginal ring is a flexible, vaginal ring used as a contraceptive (birth control method). This medicine combines 2 types of female hormones, an estrogen and a progestin. This ring is used to prevent ovulation and pregnancy. Each ring is effective for 1 month. This medicine may be used for other purposes; ask your health care provider or pharmacist if you have questions. COMMON BRAND NAME(S): EluRyng, NuvaRing What should I tell my health care provider before I take this medicine? They need to know if you have any of these conditions:  abnormal vaginal bleeding  blood vessel disease or blood clots  breast, cervical, endometrial, ovarian, liver, or uterine cancer  diabetes  gallbladder disease  having surgery  heart disease or recent heart attack  high blood pressure  high cholesterol or triglycerides  history of irregular heartbeat or heart valve problems  kidney disease  liver disease  migraine headaches  protein C deficiency  protein S deficiency  recently had a baby, miscarriage, or abortion  stroke  systemic lupus erythematosus (SLE)  tobacco smoker  your age is more than 35 years old  an unusual or allergic reaction to estrogens, progestins, other medicines, foods, dyes, or preservatives  pregnant or trying to get pregnant  breast-feeding How should I use this medicine? Insert the ring into your vagina as directed. Follow the directions on the prescription label. The ring will remain place for 3 weeks and is then removed for a 1-week break. A new ring is inserted 1 week after the last ring was removed, on the same day of the week. Check often to make sure the ring is still in place. If the ring was out of the vagina for an unknown amount of time, you may not be protected from pregnancy. Perform a pregnancy test and  call your doctor. Do not use more often than directed. A patient package insert for the product will be given with each prescription and refill. Read this sheet carefully each time. The sheet may change frequently. Contact your pediatrician regarding the use of this medicine in children. Special care may be needed. Overdosage: If you think you have taken too much of this medicine contact a poison control center or emergency room at once. NOTE: This medicine is only for you. Do not share this medicine with others. What if I miss a dose? You will need to use the ring exactly as directed. It is very important to follow the schedule every cycle. If you do not use the ring as directed, you may not be protected from pregnancy. If the ring should slip out, is lost, or if you leave it in longer or shorter than you should, contact your health care professional for advice. What may interact with this medicine? Do not take this medicine with the following medications:  dasabuvir; ombitasvir; paritaprevir; ritonavir  ombitasvir; paritaprevir; ritonavir  vaginal lubricants or other vaginal products that are oil-based or silicone-based This medicine may also interact with the following medications:  acetaminophen  antibiotics or medicines for infections, especially rifampin, rifabutin, rifapentine, and griseofulvin, and possibly penicillins or tetracyclines  aprepitant or fosaprepitant  armodafinil  ascorbic acid (vitamin C)  barbiturate medicines, such as phenobarbital or primidone  bosentan  certain antiviral medicines for hepatitis, HIV or AIDS  certain medicines for cancer treatment  certain medicines for seizures like carbamazepine, clobazam, felbamate, lamotrigine, oxcarbazepine, phenytoin,   rufinamide, topiramate  certain medicines for treating high cholesterol  cyclosporine  dantrolene  elagolix  flibanserin  grapefruit juice  lesinurad  medicines for diabetes  medicines  to treat fungal infections, such as griseofulvin, miconazole, fluconazole, ketoconazole, itraconazole, posaconazole or voriconazole  mifepristone  mitotane  modafinil  morphine  mycophenolate  St. John's wort  tamoxifen  temazepam  theophylline or aminophylline  thyroid hormones  tizanidine  tranexamic acid  ulipristal  warfarin This list may not describe all possible interactions. Give your health care provider a list of all the medicines, herbs, non-prescription drugs, or dietary supplements you use. Also tell them if you smoke, drink alcohol, or use illegal drugs. Some items may interact with your medicine. What should I watch for while using this medicine? Visit your doctor or health care professional for regular checks on your progress. You will need a regular breast and pelvic exam and Pap smear while on this medicine. Check with your doctor or health care professional to see if you need an additional method of contraception during the first cycle that you use this ring. Female condoms (made with natural rubber latex, polyisoprene, and polyurethane) and spermicides may be used. Do not use a diaphragm, cervical cap, or a female condom, as the ring can interfere with these birth control methods and their proper placement. If you have any reason to think you are pregnant, stop using this medicine right away and contact your doctor or health care professional. If you are using this medicine for hormone related problems, it may take several cycles of use to see improvement in your condition. Smoking increases the risk of getting a blood clot or having a stroke while you are using hormonal birth control, especially if you are more than 35 years old. You are strongly advised not to smoke. Some women are prone to getting dark patches on the skin of the face (cholasma). Your risk of getting chloasma with this medicine is higher if you had chloasma during a pregnancy. Keep out of the  sun. If you cannot avoid being in the sun, wear protective clothing and use sunscreen. Do not use sun lamps or tanning beds/booths. This medicine can make your body retain fluid, making your fingers, hands, or ankles swell. Your blood pressure can go up. Contact your doctor or health care professional if you feel you are retaining fluid. If you are going to have elective surgery, you may need to stop using this medicine before the surgery. Consult your health care professional for advice. This medicine does not protect you against HIV infection (AIDS) or any other sexually transmitted diseases. What side effects may I notice from receiving this medicine? Side effects that you should report to your doctor or health care professional as soon as possible:  allergic reactions such as skin rash or itching, hives, swelling of the lips, mouth, tongue, or throat  depression  high blood pressure  migraines or severe, sudden headaches  signs and symptoms of a blood clot such as breathing problems; changes in vision; chest pain; severe, sudden headache; pain, swelling, warmth in the leg; trouble speaking; sudden numbness or weakness of the face, arm or leg  signs and symptoms of infection like fever or chills with dizziness and a sunburn-like rash, or pain or trouble passing urine  stomach pain  symptoms of vaginal infection like itching, irritation or unusual discharge  yellowing of the eyes or skin Side effects that usually do not require medical attention (report these to your doctor   or health care professional if they continue or are bothersome):  acne  breast pain, tenderness  irregular vaginal bleeding or spotting, particularly during the first month of use  mild headache  nausea  painful periods  vomiting This list may not describe all possible side effects. Call your doctor for medical advice about side effects. You may report side effects to FDA at 1-800-FDA-1088. Where should I  keep my medicine? Keep out of the reach of children. Store unopened rings in the original foil pouch at room temperature between 20 and 25 degrees C (68 and 77 degrees F) for up to 4 months. Protect from light. Do not store above 30 degrees C (86 degrees F). Throw away any unused medicine after the expiration date. A ring may only be used for 1 cycle (1 month). After the 3-week cycle, a used ring is removed and should be placed in the re-closable foil pouch and discarded in the trash out of reach of children and pets. Do NOT flush down the toilet. NOTE: This sheet is a summary. It may not cover all possible information. If you have questions about this medicine, talk to your doctor, pharmacist, or health care provider.  2020 [employer]vier/Gold Standard (2017-05-22 14:41:10)  

## 2019-04-06 NOTE — Progress Notes (Signed)
GYNECOLOGY CLINIC PROCEDURE NOTE  Ms. Leslie Shaw is a 33 y.o. [phone] here for Nexplanon removal. No GYN concerns.  Last pap smear was on 06/2018 and was abnormal, positive HPV- repeat in 1 year.  No other gynecologic concerns. Interpreter at bedside during examination and procedure.   Nexplanon Removal Patient was given informed consent for removal of her Nexplanon.  Appropriate time out taken. Nexplanon site identified.  Area prepped in usual sterile fashon. One ml of 1% lidocaine was used to anesthetize the area at the distal end of the implant. A small stab incision was made right beside the implant on the distal portion.  The Nexplanon rod was grasped using hemostats and removed without difficulty.  There was minimal blood loss. There were no complications.  A bandage was applied over the small incision.  A pressure bandage was applied to reduce any bruising.  The patient tolerated the procedure well and was given post procedure instructions.  Patient is planning to use Nuvaring for contraception.  Rx for Nuvaring sent to pharmacy of choice, educated and discussed risks/benefits of Nuvaring.   Follow up in September 2020 for repeat pap.   Lajean Manes, CNM 04/06/2019 2:56 PM

## 2019-04-06 NOTE — Progress Notes (Signed)
Pt presents for Nexplanon removal d/t irregular periods. Nexplanon was inserted 11/2017 Pt requests Nuva Ring.

## 2019-05-01 IMAGING — US US MFM OB FOLLOW-UP
1 series · 14 of 28 positions shown · non-contrast
Comparison: none

[Series 1: us mfm ob follow-up · 53 acquisitions, 14 frames shown]
[im 2/53]
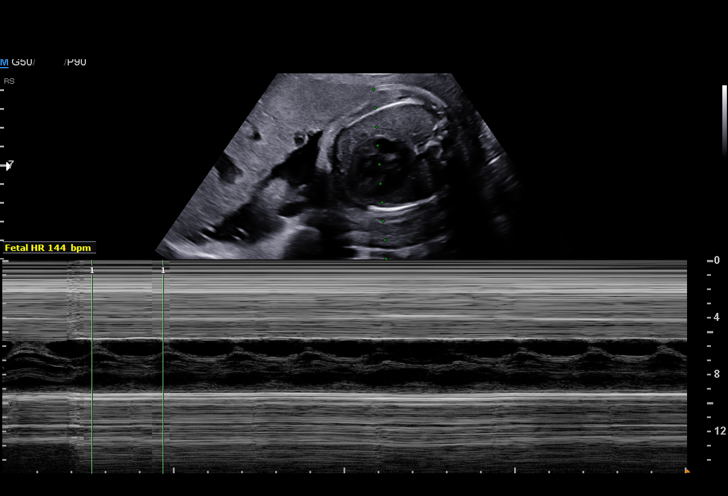
[im 6/53]
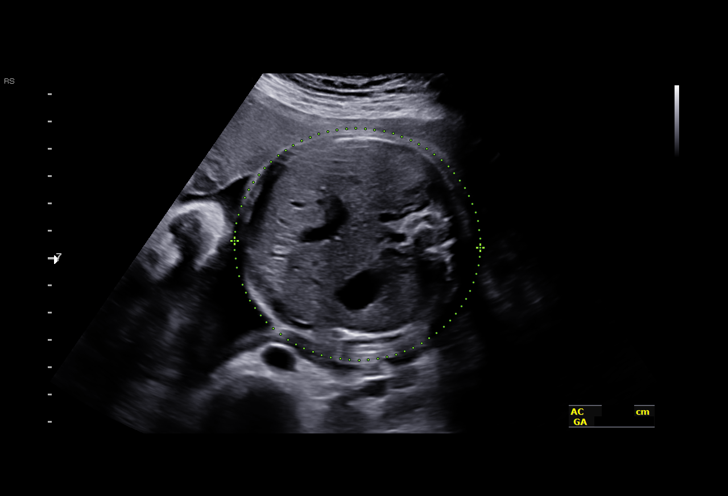
[im 10/53]
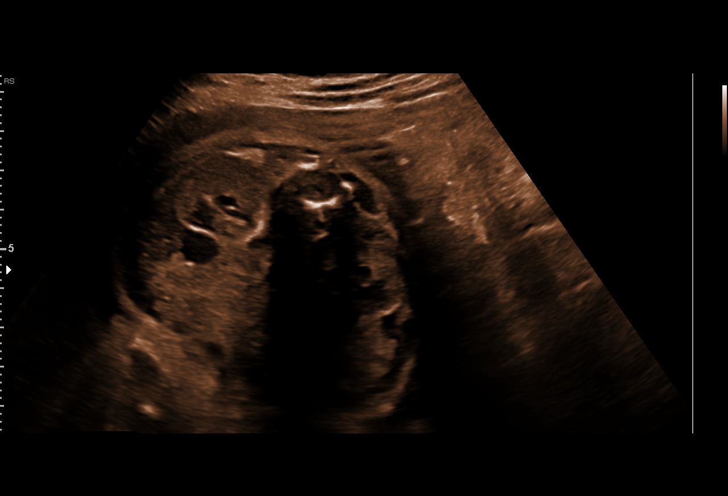
[im 14/53]
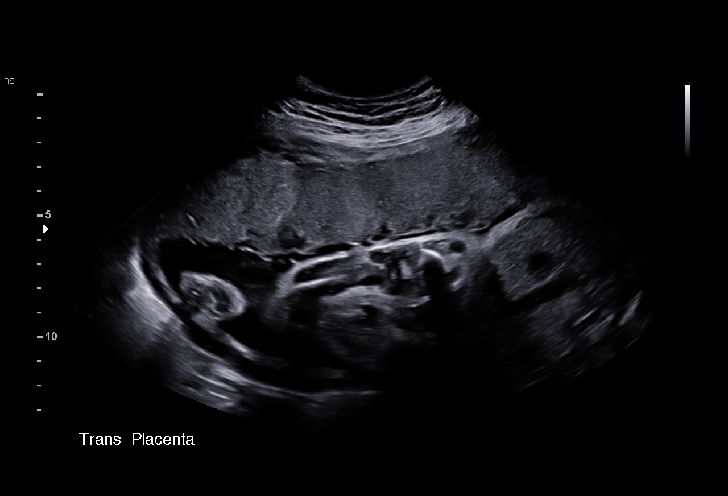
[im 18/53]
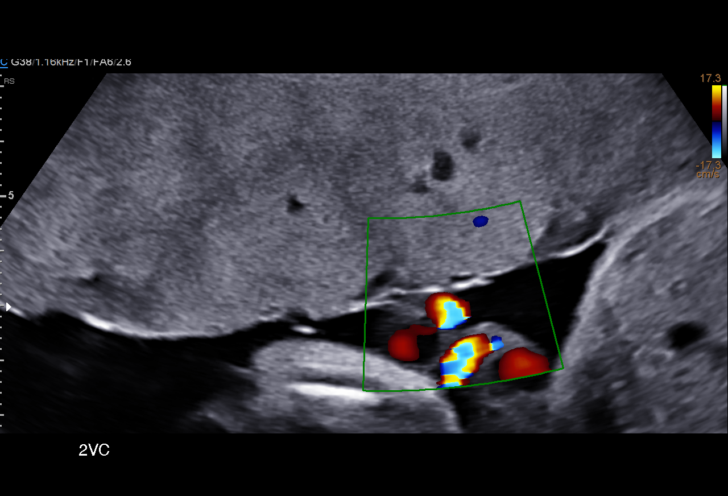
[im 22/53]
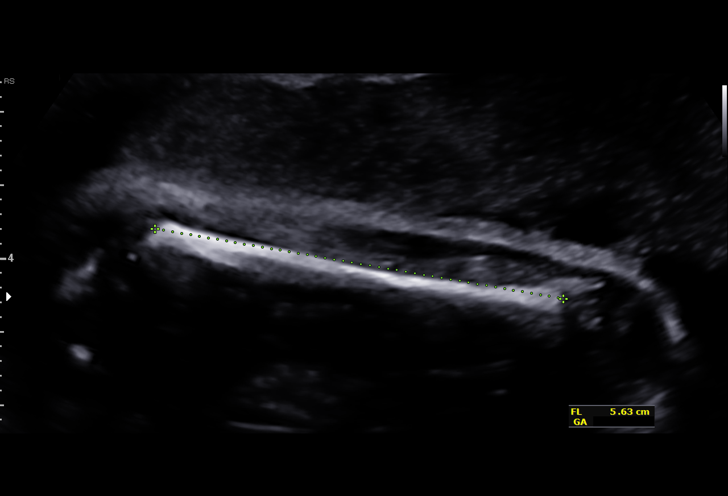
[im 26/53]
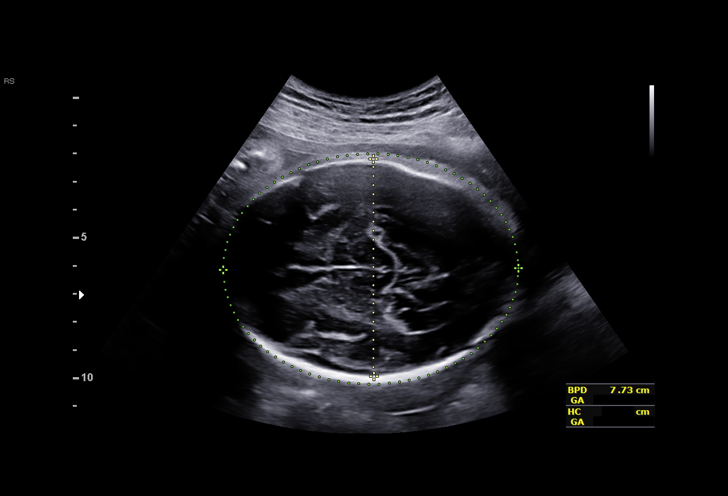
[im 29/53]
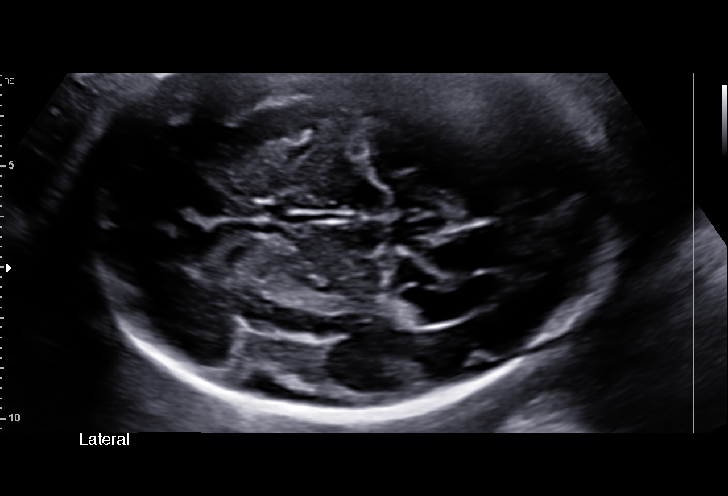
[im 33/53]
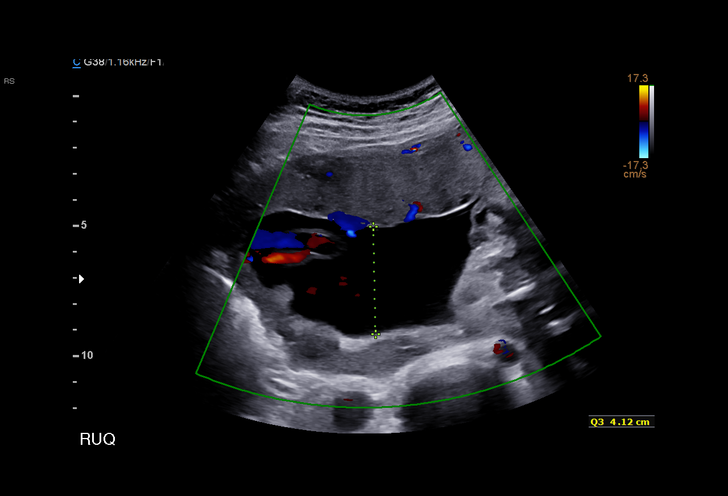
[im 37/53]
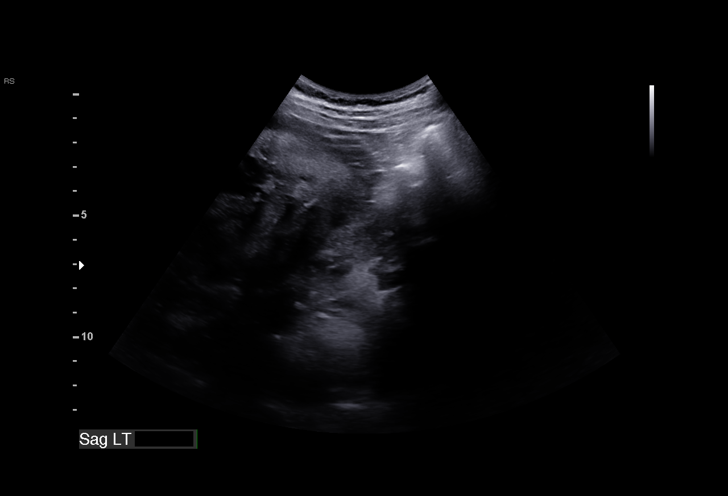
[im 41/53]
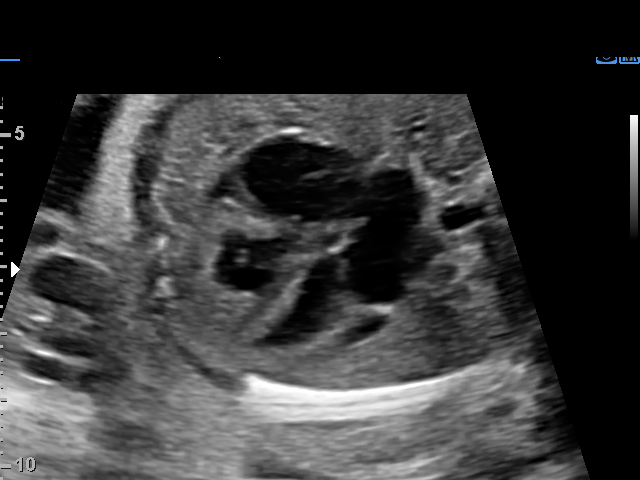
[im 45/53]
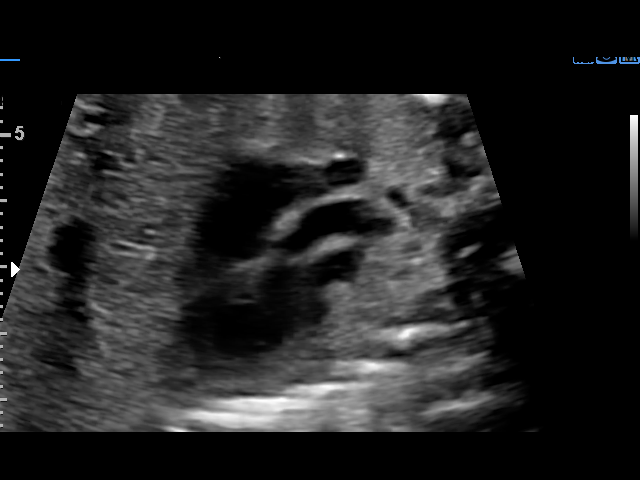
[im 49/53]
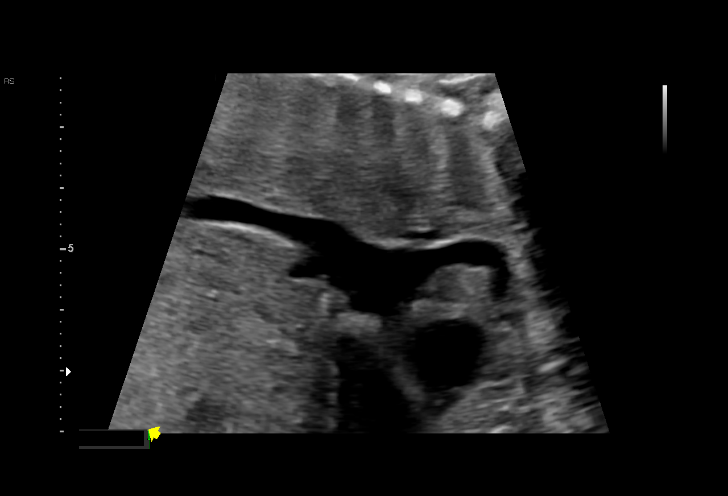
[im 53/53]
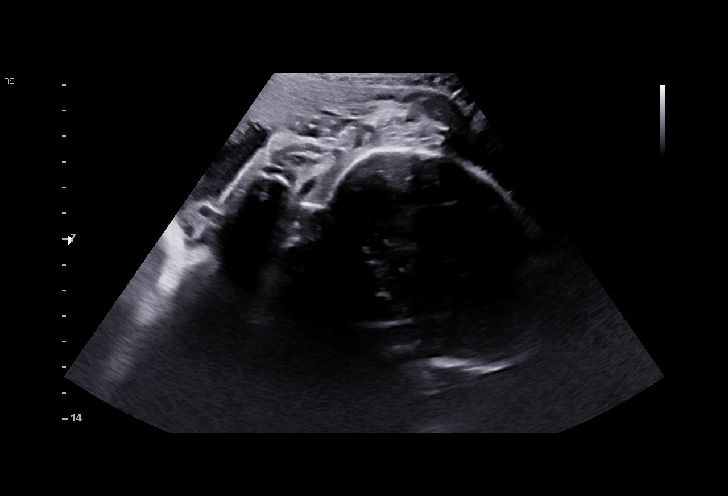

[14 of 28 positions shown; findings below may reference images not displayed]

Road [HOSPITAL]

1  EDCEL MIJAN            [id number]      [id number]     [id number]
Indications

30 weeks gestation of pregnancy
Poor obstetric history: Previous preterm
delivery, antepartum (34 weeks; PPROM)
History of cesarean delivery, currently
pregnant
2 vessel umbilical cord; low risk NIPS
Encounter for other antenatal screening
follow-up
Gestational diabetes in pregnancy, diet
controlled
OB History

Blood Type:            Height:  5'4"   Weight (lb):  119      BMI:
Gravidity:    3         Term:   1        Prem:   1        SAB:   0
TOP:          0       Ectopic:  0        Living: 2
Fetal Evaluation

Num Of Fetuses:     1
Fetal Heart         144
Rate(bpm):
Cardiac Activity:   Observed
Presentation:       Cephalic
Placenta:           Anterior, above cervical os
P. Cord Insertion:  Previously Visualized

Amniotic Fluid
AFI FV:      Subjectively within normal limits

AFI Sum(cm)     %Tile       Largest Pocket(cm)
14.63           51
RUQ(cm)       RLQ(cm)       LUQ(cm)        LLQ(cm)
3.62
Biometry

BPD:      77.4  mm     G. Age:  31w 0d         63  %    CI:        69.93   %   70 - 86
FL/HC:      19.1   %   19.2 -
HC:      295.3  mm     G. Age:  32w 4d         79  %    HC/AC:      1.08       0.99 -
AC:      272.4  mm     G. Age:  31w 2d         76  %    FL/BPD:     72.9   %   71 - 87
FL:       56.4  mm     G. Age:  29w 5d         20  %    FL/AC:      20.7   %   20 - 24
HUM:      51.6  mm     G. Age:  30w 1d         50  %

Est. FW:    4669  gm    3 lb 11 oz      65  %
Gestational Age

LMP:           30w 2d       Date:   09/02/16                 EDD:   06/09/17
U/S Today:     31w 1d                                        EDD:   06/03/17
Best:          30w 2d    Det. By:   LMP  (09/02/16)          EDD:   06/09/17
Anatomy

Cranium:               Appears normal         Aortic Arch:            Previously seen
Cavum:                 Previously seen        Ductal Arch:            Previously seen
Ventricles:            Appears normal         Diaphragm:              Previously seen
Choroid Plexus:        Appears normal         Stomach:                Appears normal, left
sided
Cerebellum:            Previously seen        Abdomen:                Appears normal
Posterior Fossa:       Previously seen        Abdominal Wall:         Previously seen
Nuchal Fold:           Previously seen        Cord Vessels:           2 vessel cord,
absent [place] Lenassi
Face:                  Orbits and profile     Kidneys:                Appear normal
previously seen
Lips:                  Previously seen        Bladder:                Appears normal
Thoracic:              Appears normal         Spine:                  Previously seen
Heart:                 Appears normal         Upper Extremities:      Previously seen
(4CH, axis, and situs
RVOT:                  Appears normal         Lower Extremities:      Previously seen
LVOT:                  Appears normal

Other:  Male gender.Heels and 5th digit previously seen. Technically difficult
due to fetal position.
Cervix Uterus Adnexa

Cervix
Not visualized (advanced GA >24wks)

Uterus
No abnormality visualized.

Left Ovary
Not visualized.

Right Ovary
Not visualized.

Adnexa:       No abnormality visualized. No adnexal mass
visualized.
Impression

Single IUP at 30w 2d
Diet controlled gestational diabetes, single umbilical artery
Single umbilical artery is again noted
The remainder of the interval anatomy appears normal
The estimated fetal weight is at the 65th %tile
Anterior placenta without previa
Normal amniotic fluid volume
Recommendations

Recommend follow-up ultrasound examination in 4 weeks for
interval growth

## 2019-07-14 ENCOUNTER — Encounter (HOSPITAL_COMMUNITY): Payer: Self-pay | Admitting: Emergency Medicine

## 2019-07-14 ENCOUNTER — Other Ambulatory Visit: Payer: Self-pay

## 2019-07-14 DIAGNOSIS — E860 Dehydration: Secondary | ICD-10-CM | POA: Insufficient documentation

## 2019-07-14 DIAGNOSIS — U071 COVID-19: Secondary | ICD-10-CM | POA: Insufficient documentation

## 2019-07-14 DIAGNOSIS — Z79899 Other long term (current) drug therapy: Secondary | ICD-10-CM | POA: Insufficient documentation

## 2019-07-14 DIAGNOSIS — J111 Influenza due to unidentified influenza virus with other respiratory manifestations: Secondary | ICD-10-CM | POA: Diagnosis not present

## 2019-07-14 DIAGNOSIS — M549 Dorsalgia, unspecified: Secondary | ICD-10-CM | POA: Diagnosis present

## 2019-07-14 NOTE — ED Triage Notes (Signed)
Patient reports generalized body aches x3 days and lower back pain since yesterday. States pain worsens with movement. Denies urinary symptoms.

## 2019-07-15 ENCOUNTER — Emergency Department (HOSPITAL_COMMUNITY): Payer: Medicaid Other

## 2019-07-15 ENCOUNTER — Emergency Department (HOSPITAL_COMMUNITY)
Admission: EM | Admit: 2019-07-15 | Discharge: 2019-07-15 | Disposition: A | Discharge status: Home / Self Care | Payer: Medicaid Other | Attending: Emergency Medicine | Admitting: Emergency Medicine

## 2019-07-15 DIAGNOSIS — E860 Dehydration: Secondary | ICD-10-CM

## 2019-07-15 DIAGNOSIS — R6889 Other general symptoms and signs: Secondary | ICD-10-CM

## 2019-07-15 LAB — URINALYSIS, ROUTINE W REFLEX MICROSCOPIC
Bilirubin Urine: NEGATIVE
Glucose, UA: NEGATIVE mg/dL
Hgb urine dipstick: NEGATIVE
Ketones, ur: 80 mg/dL — AB
Nitrite: NEGATIVE
Protein, ur: NEGATIVE mg/dL
Specific Gravity, Urine: 1.02 (ref 1.005–1.030)
pH: 6 (ref 5.0–8.0)

## 2019-07-15 LAB — CBC WITH DIFFERENTIAL/PLATELET
Abs Immature Granulocytes: 0.02 10*3/uL (ref 0.00–0.07)
Basophils Absolute: 0 10*3/uL (ref 0.0–0.1)
Basophils Relative: 0 %
Eosinophils Absolute: 0 10*3/uL (ref 0.0–0.5)
Eosinophils Relative: 0 %
HCT: 41.2 % (ref 36.0–46.0)
Hemoglobin: 13.7 g/dL (ref 12.0–15.0)
Immature Granulocytes: 1 %
Lymphocytes Relative: 25 %
Lymphs Abs: 1.1 10*3/uL (ref 0.7–4.0)
MCH: 29.2 pg (ref 26.0–34.0)
MCHC: 33.3 g/dL (ref 30.0–36.0)
MCV: 87.8 fL (ref 80.0–100.0)
Monocytes Absolute: 0.3 10*3/uL (ref 0.1–1.0)
Monocytes Relative: 7 %
Neutro Abs: 2.9 10*3/uL (ref 1.7–7.7)
Neutrophils Relative %: 67 %
Platelets: 109 10*3/uL — ABNORMAL LOW (ref 150–400)
RBC: 4.69 MIL/uL (ref 3.87–5.11)
RDW: 12.4 % (ref 11.5–15.5)
WBC: 4.3 10*3/uL (ref 4.0–10.5)
nRBC: 0 % (ref 0.0–0.2)

## 2019-07-15 LAB — COMPREHENSIVE METABOLIC PANEL
ALT: 21 U/L (ref 0–44)
AST: 29 U/L (ref 15–41)
Albumin: 3.6 g/dL (ref 3.5–5.0)
Alkaline Phosphatase: 41 U/L (ref 38–126)
Anion gap: 13 (ref 5–15)
BUN: 8 mg/dL (ref 6–20)
CO2: 19 mmol/L — ABNORMAL LOW (ref 22–32)
Calcium: 8.2 mg/dL — ABNORMAL LOW (ref 8.9–10.3)
Chloride: 104 mmol/L (ref 98–111)
Creatinine, Ser: 0.49 mg/dL (ref 0.44–1.00)
GFR calc Af Amer: >60 mL/min (ref >60)
GFR calc non Af Amer: >60 mL/min (ref >60)
Glucose, Bld: 93 mg/dL (ref 70–99)
Potassium: 3.2 mmol/L — ABNORMAL LOW (ref 3.5–5.1)
Sodium: 136 mmol/L (ref 135–145)
Total Bilirubin: 0.3 mg/dL (ref 0.3–1.2)
Total Protein: 6.8 g/dL (ref 6.5–8.1)

## 2019-07-15 LAB — PREGNANCY, URINE: Preg Test, Ur: NEGATIVE

## 2019-07-15 MED ORDER — IBUPROFEN 600 MG PO TABS: 600.0000 mg | ORAL_TABLET | Freq: Four times a day (QID) | ORAL | 0 refills | Status: DC | PRN

## 2019-07-15 MED ORDER — SODIUM CHLORIDE 0.9 % IV BOLUS: 500.0000 mL | Freq: Once | INTRAVENOUS | Status: DC

## 2019-07-15 MED ORDER — ONDANSETRON 4 MG PO TBDP: 4.0000 mg | ORAL_TABLET | Freq: Three times a day (TID) | ORAL | 0 refills | Status: DC | PRN

## 2019-07-15 MED ORDER — ONDANSETRON HCL 4 MG/2ML IJ SOLN
4.0000 mg | Freq: Once | INTRAMUSCULAR | Status: AC
  Administered 2019-07-15: 4 mg via INTRAVENOUS
  Filled 2019-07-15: qty 2

## 2019-07-15 MED ORDER — SODIUM CHLORIDE 0.9 % IV BOLUS
1000.0000 mL | Freq: Once | INTRAVENOUS | Status: AC
  Administered 2019-07-15: 02:00:00 1000 mL via INTRAVENOUS

## 2019-07-15 MED ORDER — KETOROLAC TROMETHAMINE 30 MG/ML IJ SOLN
15.0000 mg | Freq: Once | INTRAMUSCULAR | Status: AC
  Administered 2019-07-15: 15 mg via INTRAVENOUS
  Filled 2019-07-15: qty 1

## 2019-07-15 NOTE — Discharge Instructions (Signed)
Your work-up in the emergency department today has been reassuring.  You have a COVID test pending which should result in the next 1 to 2 days.  You will be notified if your test results are positive.  You may also access your test results online through [facility].  Continue to quarantine away from other individuals and family members until you receive the results of your coronavirus test.  We recommend Tylenol for fever as well as ibuprofen for pain or body aches.  Drink plenty of fluids to prevent dehydration.  You may use Zofran for management of nausea to prevent vomiting.  Get plenty of rest and follow-up with a primary care doctor to ensure that symptoms resolve.  You may return to the ED for any new or concerning symptoms.

## 2019-07-15 NOTE — ED Provider Notes (Signed)
[facility] DEPT Provider Note   CSN: [id number] Arrival date & time: 07/14/19  1950     History   Chief Complaint Chief Complaint  Patient presents with  . Back Pain  . Generalized Body Aches    HPI Leslie Shaw is a 33 y.o. female.     33 year old female presents to the emergency department for evaluation of flulike symptoms x3 days.  She has been experiencing some pain in her lower abdomen into her back.  Describes the pain as achy.  For the past 1 day, pain is also been present in her epigastrium and underneath her breasts.  This is made worse with deep breathing.  Denies any aggravation with eating.  She has been nauseous, but has not had any vomiting.  Reports some intermittent, watery, nonbloody diarrhea as well as nasal congestion.  Bilateral hands and feet will feel like they are "asleep" at times.  This is sporadic and waxes/wanes.  Was around her husband who had 2 days of fatigue recently, but no other sick contacts or known COVID exposures.  Has been taking Tylenol for symptoms with mild improvement.  No known fevers prior to arrival.  Denies dysuria, hematuria, urinary frequency or urgency.  The history is provided by the patient. No language interpreter was used.  Back Pain   History reviewed. No pertinent past medical history.  Patient Active Problem List   Diagnosis Date Noted  . GDM (gestational diabetes mellitus) 03/11/2017    Past Surgical History:  Procedure Laterality Date  . APPENDECTOMY    . CESAREAN SECTION       OB History    Gravida  3   Para  3   Term  2   Preterm  1   AB      Living  3     SAB      TAB      Ectopic      Multiple  0   Live Births  3        Obstetric Comments  Patient had twin pregnancy- she had C-section delivery at 15 weeks- emergent delivery water broke- week 29 they knew of 1 twin demise         Home Medications    Prior to Admission medications   Medication Sig  Start Date End Date Taking? Authorizing Provider  ibuprofen (ADVIL) 600 MG tablet Take 1 tablet (600 mg total) by mouth every 6 (six) hours as needed for headache, mild pain or moderate pain. 07/15/19   Antonietta Breach, PA-C  ondansetron (ZOFRAN ODT) 4 MG disintegrating tablet Take 1 tablet (4 mg total) by mouth every 8 (eight) hours as needed for nausea or vomiting. 07/15/19   Antonietta Breach, PA-C  etonogestrel-ethinyl estradiol (NUVARING) 0.12-0.015 MG/24HR vaginal ring Insert vaginally and leave in place for 3 consecutive weeks, then remove for 1 week. Patient not taking: Reported on 07/15/2019 04/06/19 07/15/19  Lajean Manes, CNM  norgestrel-ethinyl estradiol (LO/OVRAL,CRYSELLE) 0.3-30 MG-MCG tablet Take 1 tablet by mouth daily. Patient not taking: Reported on 04/06/2019 07/02/18 07/15/19  Emily Filbert, MD    Family History No family history on file.  Social History Social History   Tobacco Use  . Smoking status: Never Smoker  . Smokeless tobacco: Never Used  Substance Use Topics  . Alcohol use: No  . Drug use: No     Allergies   Other, Pistachio nut (diagnostic), and Shrimp [shellfish allergy]   Review of Systems Review of  Systems  Musculoskeletal: Positive for back pain.  Ten systems reviewed and are negative for acute change, except as noted in the HPI.    Physical Exam Updated Vital Signs BP 90/60   Pulse 89   Temp 98.4 F (36.9 C) (Oral)   Resp 16   Ht 5\' 3"  (1.6 m)   Wt 49.9 kg   SpO2 97%   BMI 19.49 kg/m   Physical Exam Vitals signs and nursing note reviewed.  Constitutional:      General: She is not in acute distress.    Appearance: She is well-developed. She is not diaphoretic.     Comments: Nontoxic appearing and in NAD  HENT:     Head: Normocephalic and atraumatic.  Eyes:     General: No scleral icterus.    Conjunctiva/sclera: Conjunctivae normal.  Neck:     Musculoskeletal: Normal range of motion.  Cardiovascular:     Rate and Rhythm: Normal rate  and regular rhythm.     Pulses: Normal pulses.  Pulmonary:     Effort: Pulmonary effort is normal. No respiratory distress.     Breath sounds: No stridor. No wheezing, rhonchi or rales.     Comments: Lungs grossly CTAB Abdominal:     Palpations: Abdomen is soft.     Comments: Mild suprapubic TTP and epigastric TTP.  Negative Murphy sign.  No tenderness at McBurney's point.  Abdomen soft without distention or peritoneal signs.  Musculoskeletal: Normal range of motion.  Skin:    General: Skin is warm and dry.     Coloration: Skin is not pale.     Findings: No erythema or rash.  Neurological:     Mental Status: She is alert and oriented to person, place, and time.     Coordination: Coordination normal.     Comments: GCS 15.  Moving all extremities spontaneously.  Psychiatric:        Behavior: Behavior normal.      ED Treatments / Results  Labs (all labs ordered are listed, but only abnormal results are displayed) Labs Reviewed  URINALYSIS, ROUTINE W REFLEX MICROSCOPIC - Abnormal; Notable for the following components:      Result Value   APPearance CLOUDY (*)    Ketones, ur 80 (*)    Leukocytes,Ua MODERATE (*)    Bacteria, UA RARE (*)    All other components within normal limits  CBC WITH DIFFERENTIAL/PLATELET - Abnormal; Notable for the following components:   Platelets 109 (*)    All other components within normal limits  COMPREHENSIVE METABOLIC PANEL - Abnormal; Notable for the following components:   Potassium 3.2 (*)    CO2 19 (*)    Calcium 8.2 (*)    All other components within normal limits  NOVEL CORONAVIRUS, NAA (HOSP ORDER, SEND-OUT TO REF LAB; TAT 18-24 HRS)  PREGNANCY, URINE    EKG None  Radiology Dg Chest Port 1 View  Result Date: 07/15/2019 CLINICAL DATA:  Pleuritic pain EXAM: PORTABLE CHEST 1 VIEW COMPARISON:  None. FINDINGS: The heart size and mediastinal contours are within normal limits. Both lungs are clear. The visualized skeletal structures are  unremarkable. IMPRESSION: No acute cardiopulmonary process. Electronically Signed   By: Jonna ClarkBindu  Avutu M.D.   On: 07/15/2019 02:18    Procedures Procedures (including critical care time)  Medications Ordered in ED Medications  sodium chloride 0.9 % bolus 1,000 mL (0 mLs Intravenous Stopped 07/15/19 0342)  ketorolac (TORADOL) 30 MG/ML injection 15 mg (15 mg Intravenous Given 07/15/19 0229)  ondansetron [facility]) injection 4 mg (4 mg Intravenous Given 07/15/19 0229)     Initial Impression / Assessment and Plan / ED Course  I have reviewed the triage vital signs and the nursing notes.  Pertinent labs & imaging results that were available during my care of the patient were reviewed by me and considered in my medical decision making (see chart for details).        Patient with symptoms consistent with viral illness.  Vitals are stable, afebrile.  Lungs are clear with negative CXR.  Labs reassuring.  UA suggestive of dehydration, but patient given IVF while in the ED.  No persistent vomiting.  Patient will be discharged with instructions to orally hydrate, rest, and use over-the-counter medications such as anti-inflammatories ibuprofen for muscle aches and Tylenol for fever.  Patient will also be given Zofran for nausea management.  Advised that she quarantine until her COVID results return in 1-2 days.  Return precautions discussed and provided. Patient discharged in stable condition with no unaddressed concerns.   Leslie Shaw was evaluated in Emergency Department on 07/15/2019 for the symptoms described in the history of present illness. She was evaluated in the context of the global COVID-19 pandemic, which necessitated consideration that the patient might be at risk for infection with the SARS-CoV-2 virus that causes COVID-19. Institutional protocols and algorithms that pertain to the evaluation of patients at risk for COVID-19 are in a state of rapid change based on information released by  regulatory bodies including the CDC and federal and state organizations. These policies and algorithms were followed during the patient's care in the ED.   Final Clinical Impressions(s) / ED Diagnoses   Final diagnoses:  Flu-like symptoms  Dehydration    ED Discharge Orders         Ordered    ondansetron (ZOFRAN ODT) 4 MG disintegrating tablet  Every 8 hours PRN     07/15/19 0419    ibuprofen (ADVIL) 600 MG tablet  Every 6 hours PRN     07/15/19 0419           Antony Madura, PA-C 07/15/19 0507    Ward, Layla Maw, DO 07/15/19 1700

## 2019-07-15 NOTE — ED Notes (Signed)
Lab called regarding CBC results. Lab states the machine was under maintenance and will be done in 15 min.

## 2019-07-18 LAB — NOVEL CORONAVIRUS, NAA (HOSP ORDER, SEND-OUT TO REF LAB; TAT 18-24 HRS): SARS-CoV-2, NAA: DETECTED — AB

## 2019-07-18 NOTE — ED Notes (Signed)
Lab called on 10/12/2020m with positive COVID results for the patient, Charge RN Anderson Malta called the patient to inform her and told her to call her primary doctor and discuss this further.

## 2020-01-22 ENCOUNTER — Encounter: Payer: Self-pay | Admitting: Certified Nurse Midwife

## 2020-03-14 ENCOUNTER — Other Ambulatory Visit: Payer: Self-pay

## 2020-03-14 ENCOUNTER — Other Ambulatory Visit (HOSPITAL_COMMUNITY)
Admission: RE | Admit: 2020-03-14 | Discharge: 2020-03-14 | Disposition: A | Discharge status: Home / Self Care | Payer: Medicaid Other | Source: Ambulatory Visit | Attending: Nurse Practitioner | Admitting: Nurse Practitioner

## 2020-03-14 ENCOUNTER — Ambulatory Visit: Payer: Medicaid Other | Admitting: Nurse Practitioner

## 2020-03-14 ENCOUNTER — Encounter: Payer: Self-pay | Admitting: Nurse Practitioner

## 2020-03-14 VITALS — BP 108/71 | HR 81 | Wt 120.0 lb

## 2020-03-14 DIAGNOSIS — Z0000 Encounter for general adult medical examination without abnormal findings: Secondary | ICD-10-CM

## 2020-03-14 DIAGNOSIS — Z3044 Encounter for surveillance of vaginal ring hormonal contraceptive device: Secondary | ICD-10-CM

## 2020-03-14 DIAGNOSIS — Z01419 Encounter for gynecological examination (general) (routine) without abnormal findings: Secondary | ICD-10-CM | POA: Diagnosis present

## 2020-03-14 MED ORDER — ETONOGESTREL-ETHINYL ESTRADIOL 0.12-0.015 MG/24HR VA RING: VAGINAL_RING | VAGINAL | 12 refills | Status: DC

## 2020-03-14 NOTE — Progress Notes (Signed)
GYNECOLOGY ANNUAL PREVENTATIVE CARE ENCOUNTER NOTE  Subjective:   Leslie Shaw is a 34 y.o. [phone] female here for a routine annual gynecologic exam.  Current complaints: needs annual exam and Nuvaring renewed.   Denies abnormal vaginal bleeding, discharge, pelvic pain, problems with intercourse or other gynecologic concerns.    Gynecologic History Patient's last menstrual period was 02/20/2020. Contraception: NuvaRing vaginal inserts Last Pap: 2019. Results were: normal with positive HPV  Obstetric History OB History  Gravida Para Term Preterm AB Living  3 3 2 1   3   SAB TAB Ectopic Multiple Live Births        0 3    # Outcome Date GA Lbr Len/2nd Weight Sex Delivery Anes PTL Lv  3 Term 05/23/17 [redacted]w[redacted]d 34:45 / 00:22 6 lb 12.1 oz (3.065 kg) M VBAC EPI  LIV     Birth Comments: None  2 Preterm 12/31/07 [redacted]w[redacted]d  4 lb 13.6 oz (2.2 kg) F CS-LTranv Spinal Y LIV  1 Term 04/21/05 [redacted]w[redacted]d  5 lb 15.2 oz (2.7 kg) M Vag-Spont None N LIV    Obstetric Comments  Patient had twin pregnancy- she had C-section delivery at 36 weeks- emergent delivery water broke- week 61 they knew of 1 twin demise    History reviewed. No pertinent past medical history.  Past Surgical History:  Procedure Laterality Date  . APPENDECTOMY    . CESAREAN SECTION      Current Outpatient Medications on File Prior to Visit  Medication Sig Dispense Refill  . ibuprofen (ADVIL) 600 MG tablet Take 1 tablet (600 mg total) by mouth every 6 (six) hours as needed for headache, mild pain or moderate pain. (Patient not taking: Reported on 03/14/2020) 30 tablet 0  . ondansetron (ZOFRAN ODT) 4 MG disintegrating tablet Take 1 tablet (4 mg total) by mouth every 8 (eight) hours as needed for nausea or vomiting. (Patient not taking: Reported on 03/14/2020) 10 tablet 0  . [DISCONTINUED] norgestrel-ethinyl estradiol (LO/OVRAL,CRYSELLE) 0.3-30 MG-MCG tablet Take 1 tablet by mouth daily. (Patient not taking: Reported on 04/06/2019) 1 Package 11    No current facility-administered medications on file prior to visit.    Allergies  Allergen Reactions  . Other Swelling    Tree nuts- pine cone  . Pistachio Nut (Diagnostic) Swelling  . Shrimp [Shellfish Allergy] Swelling    Social History   Socioeconomic History  . Marital status: Married    Spouse name: Not on file  . Number of children: Not on file  . Years of education: Not on file  . Highest education level: Not on file  Occupational History  . Not on file  Tobacco Use  . Smoking status: Never Smoker  . Smokeless tobacco: Never Used  Substance and Sexual Activity  . Alcohol use: No  . Drug use: No  . Sexual activity: Yes    Birth control/protection: None, Inserts  Other Topics Concern  . Not on file  Social History Narrative  . Not on file   Social Determinants of Health   Financial Resource Strain:   . Difficulty of Paying Living Expenses:   Food Insecurity:   . Worried About Charity fundraiser in the Last Year:   . Arboriculturist in the Last Year:   Transportation Needs:   . Film/video editor (Medical):   Marland Kitchen Lack of Transportation (Non-Medical):   Physical Activity:   . Days of Exercise per Week:   . Minutes of Exercise per Session:  Stress:   . Feeling of Stress :   Social Connections:   . Frequency of Communication with Friends and Family:   . Frequency of Social Gatherings with Friends and Family:   . Attends Religious Services:   . Active Member of Clubs or Organizations:   . Attends Banker Meetings:   Marland Kitchen Marital Status:   Intimate Partner Violence:   . Fear of Current or Ex-Partner:   . Emotionally Abused:   Marland Kitchen Physically Abused:   . Sexually Abused:     History reviewed. No pertinent family history.  The following portions of the patient's history were reviewed and updated as appropriate: allergies, current medications, past family history, past medical history, past social history, past surgical history and problem  list.  Review of Systems Pertinent items noted in HPI and remainder of comprehensive ROS otherwise negative.   Objective:  BP 108/71   Pulse 81   Wt 120 lb (54.4 kg)   LMP 02/20/2020   BMI 21.26 kg/m  CONSTITUTIONAL: Well-developed, well-nourished female in no acute distress.  HENT:  Normocephalic, atraumatic, External right and left ear normal.  EYES: Conjunctivae and EOM are normal. Pupils are equal, round.  No scleral icterus.  NECK: Normal range of motion, supple, no masses.  Normal thyroid.  SKIN: Skin is warm and dry. No rash noted. Not diaphoretic. No erythema. No pallor. NEUROLOGIC: Alert and oriented to person, place, and time. Normal reflexes, muscle tone coordination. No cranial nerve deficit noted. PSYCHIATRIC: Normal mood and affect. Normal behavior. Normal judgment and thought content. CARDIOVASCULAR: Normal heart rate noted, regular rhythm RESPIRATORY: Clear to auscultation bilaterally. Effort and breath sounds normal, no problems with respiration noted. BREASTS: Symmetric in size. No masses, skin changes, nipple drainage, or lymphadenopathy. ABDOMEN: Soft, no distention noted.  No tenderness, rebound or guarding.  PELVIC: Normal appearing external genitalia; normal appearing vaginal mucosa and cervix.  No abnormal discharge noted.  Pap smear obtained.  Normal uterine size, no other palpable masses, no uterine or adnexal tenderness. Nuvaring in place. MUSCULOSKELETAL: Normal range of motion. No tenderness.  No cyanosis, clubbing, or edema.    Assessment and Plan:  1. Encounter for annual routine gynecological examination Very tense with pelvic BMI 21 - appropriate Plans no more children Declines STD testing  - Cytology - PAP  2.  Supervision of Nuvaring - refill done  Will follow up results of pap smear and manage accordingly. Routine preventative health maintenance measures emphasized. Please refer to After Visit Summary for other counseling recommendations.     Nolene Bernheim, RN, MSN, NP-BC Nurse Practitioner, [facility] for [facility]

## 2020-03-14 NOTE — Progress Notes (Signed)
Pt presents for annual, pap, needs rf Nuvaring GAD = 0 STD testing offered; pt declined.

## 2020-03-14 NOTE — Progress Notes (Deleted)
Pt presents for Annual Exam    Last pap: 07/02/2018 +HPV  LMP: Contraception: STD Screening:   CC:

## 2020-03-15 LAB — CYTOLOGY - PAP
Comment: NEGATIVE
Diagnosis: NEGATIVE
High risk HPV: NEGATIVE

## 2020-04-05 DIAGNOSIS — Z419 Encounter for procedure for purposes other than remedying health state, unspecified: Secondary | ICD-10-CM | POA: Diagnosis not present

## 2020-05-06 DIAGNOSIS — Z419 Encounter for procedure for purposes other than remedying health state, unspecified: Secondary | ICD-10-CM | POA: Diagnosis not present

## 2020-05-31 DIAGNOSIS — R5383 Other fatigue: Secondary | ICD-10-CM | POA: Diagnosis not present

## 2020-05-31 DIAGNOSIS — Z0000 Encounter for general adult medical examination without abnormal findings: Secondary | ICD-10-CM | POA: Diagnosis not present

## 2020-05-31 DIAGNOSIS — Z13220 Encounter for screening for lipoid disorders: Secondary | ICD-10-CM | POA: Diagnosis not present

## 2020-05-31 DIAGNOSIS — Z13228 Encounter for screening for other metabolic disorders: Secondary | ICD-10-CM | POA: Diagnosis not present

## 2020-06-06 DIAGNOSIS — Z419 Encounter for procedure for purposes other than remedying health state, unspecified: Secondary | ICD-10-CM | POA: Diagnosis not present

## 2020-07-06 DIAGNOSIS — Z419 Encounter for procedure for purposes other than remedying health state, unspecified: Secondary | ICD-10-CM | POA: Diagnosis not present

## 2020-08-06 DIAGNOSIS — Z419 Encounter for procedure for purposes other than remedying health state, unspecified: Secondary | ICD-10-CM | POA: Diagnosis not present

## 2020-09-05 DIAGNOSIS — Z419 Encounter for procedure for purposes other than remedying health state, unspecified: Secondary | ICD-10-CM | POA: Diagnosis not present

## 2020-09-17 ENCOUNTER — Ambulatory Visit (INDEPENDENT_AMBULATORY_CARE_PROVIDER_SITE_OTHER): Payer: Medicaid Other | Admitting: Obstetrics and Gynecology

## 2020-09-17 ENCOUNTER — Encounter: Payer: Self-pay | Admitting: Obstetrics and Gynecology

## 2020-09-17 ENCOUNTER — Other Ambulatory Visit: Payer: Self-pay

## 2020-09-17 VITALS — BP 99/67 | HR 85 | Wt 120.0 lb

## 2020-09-17 DIAGNOSIS — R32 Unspecified urinary incontinence: Secondary | ICD-10-CM | POA: Diagnosis not present

## 2020-09-17 NOTE — Progress Notes (Signed)
34 yo P3 presenting today for evaluation of worsening urinary incontinence over the past 6 months. She denies dysuria, frequency or urgency. She reports urinary incontinence mainly when coughing and extremely tired. Patient feels that her symptoms have worsened in the sense that previously she needed to cough multiple times before leakage of urine occurred whereas now she leaks after one cough. Patient has 3 children and has had 2 vaginal birth. She is without any other complaints  No past medical history on file. Past Surgical History:  Procedure Laterality Date  . APPENDECTOMY    . CESAREAN SECTION     No family history on file. Social History   Tobacco Use  . Smoking status: Never Smoker  . Smokeless tobacco: Never Used  Vaping Use  . Vaping Use: Never used  Substance Use Topics  . Alcohol use: No  . Drug use: No   ROS See pertinent in HPI. All other systems reviewed  GENERAL: Well-developed, well-nourished female in no acute distress.  ABDOMEN: Soft, nontender, nondistended. No organomegaly. PELVIC: Normal external female genitalia. Vagina is pink and rugated.  Normal discharge. Normal appearing cervix. Uterus is normal in size. No adnexal mass or tenderness. EXTREMITIES: No cyanosis, clubbing, or edema, 2+ distal pulses.  A/P 34 yo P3 with urinary incontinence - Urine culture collected - Discussed the benefits of kegel exercises - Patient referred to urogynecology

## 2020-09-17 NOTE — Progress Notes (Signed)
Pt states she has constant leaking of urine x 6 months. Pt states it has recently gotten worse.  Pt denies any urinary symptoms.

## 2020-09-19 ENCOUNTER — Telehealth: Payer: Self-pay | Admitting: *Deleted

## 2020-09-19 LAB — URINE CULTURE

## 2020-09-19 NOTE — Telephone Encounter (Signed)
Pt called to office for UC results. Pt made aware results are normal, no sign of infection.  Pt states a referral was mentioned at visit if her urine test was normal.  Pt made aware that message will be sent to provider for review.   Pt has current referral to urogyn.

## 2020-09-20 NOTE — Telephone Encounter (Signed)
I referred her to urogyn only. Thank you

## 2020-09-20 NOTE — Progress Notes (Signed)
Leslie Shaw New Patient Evaluation and Consultation  Referring Provider: Catalina Antigua, MD PCP: Leslie Able, MD Date of Service: 09/26/2020  SUBJECTIVE Chief Complaint: No chief complaint on file.  History of Present Illness: Leslie Shaw is a 34 y.o. White or Caucasian  female seen in consultation at the request of Leslie Shaw for evaluation of incontinece.    Review of records from Dr Leslie Shaw significant for: Leakage of urine with cough, which has gradually worsened.   Urine culture 06/18/20: mixed urogenital flora  Urinary Symptoms: Leaks urine with cough/ sneeze, laughing and lifting Leaks multiple time(s) per days.  Pad use: 4 liners/ mini-pads per day.   She is bothered by her UI symptoms.  Day time voids 10.  Nocturia: 0 times per night to void. Voiding dysfunction: she empties her bladder well.  does not use a catheter to empty bladder.  When urinating, she feels she has no difficulties  UTIs: 0 UTI's in the last year.   Denies history of blood in urine and kidney or bladder stones  Pelvic Organ Prolapse Symptoms:                  She Denies a feeling of a bulge the vaginal area.   Bowel Symptom: Bowel movements: 1 time(s) per day Stool consistency: soft  or loose Straining: no.  Splinting: no.  Incomplete evacuation: no.  She Denies accidental bowel leakage / fecal incontinence Bowel regimen: none Last colonoscopy: n/a  Sexual Function Sexually active: yes.  Sexual orientation: Straight Pain with sex: No  Pelvic Pain Denies pelvic pain   Past Medical History: History reviewed. No pertinent past medical history.   Past Surgical History:   Past Surgical History:  Procedure Laterality Date   APPENDECTOMY     CESAREAN SECTION       Past OB/GYN History: G3 P3 Vaginal deliveries: 2,  Forceps/ Vacuum deliveries: 0, Cesarean section: 1 Menopausal: No, regular periods Contraception: Ring. Not planning for more children Last pap  smear was 03/2020.  Any history of abnormal pap smears: no.   Medications: She has a current medication list which includes the following prescription(s): etonogestrel-ethinyl estradiol, ibuprofen, ondansetron, and [DISCONTINUED] norgestrel-ethinyl estradiol.   Allergies: Patient is allergic to other, pistachio nut (diagnostic), and shrimp [shellfish allergy].   Social History:  Social History   Tobacco Use   Smoking status: Never Smoker   Smokeless tobacco: Never Used  Building services engineer Use: Never used  Substance Use Topics   Alcohol use: No   Drug use: No    Relationship status: married She lives with husband and children.   She is not employed. Regular exercise: No   Family History:  History reviewed. No pertinent family history.   Review of Systems: Review of Systems  Constitutional: Negative for fever, malaise/fatigue and weight loss.  Respiratory: Negative for cough, shortness of breath and wheezing.   Cardiovascular: Negative for chest pain, palpitations and leg swelling.  Gastrointestinal: Negative for abdominal pain and blood in stool.  Genitourinary: Negative for dysuria.       + vaginal discharge  Musculoskeletal: Negative for myalgias.  Skin: Negative for rash.  Neurological: Negative for dizziness and headaches.  Endo/Heme/Allergies: Does not bruise/bleed easily.  Psychiatric/Behavioral: Negative for depression. The patient is not nervous/anxious.      OBJECTIVE Physical Exam: Vitals:   09/26/20 1446  BP: 112/79  Pulse: 83  Weight: 113 lb (51.3 kg)    Physical Exam Constitutional:  General: She is not in acute distress. Pulmonary:     Effort: Pulmonary effort is normal.  Abdominal:     General: There is no distension.     Palpations: Abdomen is soft.     Tenderness: There is no abdominal tenderness. There is no rebound.  Musculoskeletal:        General: No swelling. Normal range of motion.  Skin:    General: Skin is warm and dry.      Findings: No rash.  Neurological:     Mental Status: She is alert and oriented to person, place, and time.  Psychiatric:        Mood and Affect: Mood normal.        Behavior: Behavior normal.      GU / Detailed Urogynecologic Evaluation:  Pelvic Exam: Normal external female genitalia; Bartholin's and Skene's glands normal in appearance; urethral meatus normal in appearance, no urethral masses or discharge.   CST: negative   Speculum exam reveals normal vaginal mucosa without atrophy. Cervix normal appearance. Nuva ring noted in vagina.  Uterus normal single, nontender. Adnexa no mass, fullness, tenderness.     Pelvic floor strength II/V  Pelvic floor musculature: Right levator non-tender, Right obturator non-tender, Left levator non-tender, Left obturator non-tender  POP-Q:   POP-Q  -3                                            Aa   -3                                           Ba  -6                                              C   3                                            Gh  2.5                                            Pb  7                                            tvl   -2                                            Ap  -2                                            Bp  -6  D     Rectal Exam:  Normal external rectum  Post-Void Residual (PVR) by Bladder Scan: In order to evaluate bladder emptying, we discussed obtaining a postvoid residual and she agreed to this procedure.  Procedure: The ultrasound unit was placed on the patient's abdomen in the suprapubic region after the patient had voided. A PVR of 5 ml was obtained by bladder scan.  Laboratory Results: POC urine: +protein  I visualized the urine specimen, noting the specimen to be dark yellow  ASSESSMENT AND PLAN Leslie Shaw is a 34 y.o. with:  1. Stress incontinence   2. Frequent urination     Interpretation and history was obtained from  patient's husband. Additional interpretation with video interpreter was used during exam.   1. SUI For treatment of stress urinary incontinence,  non-surgical options include expectant management, weight loss, physical therapy, as well as a pessary.  Surgical options include a midurethral sling, Burch urethropexy, and transurethral injection of a bulking agent. - She is interested in starting pelvic floor physical therapy, referral place. Also provided handouts on pelvic floor exercises from [employer] in Arabic.   2. Urinary frequency - POC urine and recent culture negative for infection - not bothersome at this time compared to leakage symptoms.   Return as needed if she does not see improvement with PT  Marguerita Beards, MD   Medical Decision Making:  - Reviewed/ ordered a clinical laboratory test - Assessment requiring independent historian - Review and summation of prior records

## 2020-09-26 ENCOUNTER — Other Ambulatory Visit: Payer: Self-pay

## 2020-09-26 ENCOUNTER — Ambulatory Visit (INDEPENDENT_AMBULATORY_CARE_PROVIDER_SITE_OTHER): Payer: Medicaid Other | Admitting: Obstetrics and Gynecology

## 2020-09-26 ENCOUNTER — Encounter: Payer: Self-pay | Admitting: Obstetrics and Gynecology

## 2020-09-26 VITALS — BP 112/79 | HR 83 | Wt 113.0 lb

## 2020-09-26 DIAGNOSIS — R350 Frequency of micturition: Secondary | ICD-10-CM | POA: Diagnosis not present

## 2020-09-26 DIAGNOSIS — N393 Stress incontinence (female) (male): Secondary | ICD-10-CM

## 2020-09-26 LAB — POCT URINALYSIS DIPSTICK
Appearance: NORMAL
Bilirubin, UA: NEGATIVE
Blood, UA: NEGATIVE
Glucose, UA: NEGATIVE
Ketones, UA: NEGATIVE
Leukocytes, UA: NEGATIVE
Nitrite, UA: NEGATIVE
Protein, UA: POSITIVE — AB
Spec Grav, UA: 1.025 (ref 1.010–1.025)
Urobilinogen, UA: 0.2 E.U./dL
pH, UA: 6 (ref 5.0–8.0)

## 2020-10-06 DIAGNOSIS — Z419 Encounter for procedure for purposes other than remedying health state, unspecified: Secondary | ICD-10-CM | POA: Diagnosis not present

## 2020-11-06 DIAGNOSIS — Z419 Encounter for procedure for purposes other than remedying health state, unspecified: Secondary | ICD-10-CM | POA: Diagnosis not present

## 2020-12-04 DIAGNOSIS — Z419 Encounter for procedure for purposes other than remedying health state, unspecified: Secondary | ICD-10-CM | POA: Diagnosis not present

## 2020-12-07 ENCOUNTER — Other Ambulatory Visit: Payer: Self-pay

## 2020-12-07 ENCOUNTER — Encounter: Payer: Self-pay | Admitting: Physical Therapy

## 2020-12-07 ENCOUNTER — Ambulatory Visit: Payer: Medicaid Other | Attending: Obstetrics and Gynecology | Admitting: Physical Therapy

## 2020-12-07 DIAGNOSIS — R278 Other lack of coordination: Secondary | ICD-10-CM | POA: Diagnosis not present

## 2020-12-07 DIAGNOSIS — N393 Stress incontinence (female) (male): Secondary | ICD-10-CM | POA: Insufficient documentation

## 2020-12-07 DIAGNOSIS — M6281 Muscle weakness (generalized): Secondary | ICD-10-CM | POA: Insufficient documentation

## 2020-12-07 DIAGNOSIS — M62838 Other muscle spasm: Secondary | ICD-10-CM | POA: Diagnosis not present

## 2020-12-07 NOTE — Therapy (Signed)
[facility] [address] [place], [place], [address] Phone: [phone]   Fax:  [phone]  Physical Therapy Evaluation  Patient Details  Name: Leslie Shaw MRN: [id number] Date of Birth: 02/08/1986 Referring Provider (PT): Dr. Lanetta Inch   Encounter Date: 12/07/2020   PT End of Session - 12/07/20 1140    Visit Number 1    Date for PT Re-Evaluation 03/01/21    Authorization Type wellcare    PT Start Time 1100    PT Stop Time 1135    PT Time Calculation (min) 35 min    Activity Tolerance Patient tolerated treatment well    Behavior During Therapy [facility] for tasks assessed/performed           History reviewed. No pertinent past medical history.  Past Surgical History:  Procedure Laterality Date  . APPENDECTOMY    . CESAREAN SECTION      There were no vitals filed for this visit.    Subjective Assessment - 12/07/20 1111    Subjective Patient delivered on 05/23/2017 started with issues. When she wants to go to the bathroom will leak. Leak with laugh, coughing, sneezing, carrying, walking. 1 pad per day.    Patient is accompained by: Interpreter    Patient Stated Goals reduce leakage    Currently in Pain? No/denies    Multiple Pain Sites No              [facility] PT Assessment - 12/07/20 0001      Assessment   Medical Diagnosis N39.3 STress incontinence    Referring Provider (PT) Dr. Lanetta Inch    Onset Date/Surgical Date --   2018   Prior Therapy none      Precautions   Precautions None      Restrictions   Weight Bearing Restrictions No      Balance Screen   Has the patient fallen in the past 6 months No    Has the patient had a decrease in activity level because of a fear of falling?  No    Is the patient reluctant to leave their home because of a fear of falling?  No      Home [employer] information [employer] manager residence      Prior Function   Level of Independence  Independent    Vocation Requirements sitting    Leisure none      Cognition   Overall Cognitive Status Within Functional Limits for tasks assessed      Observation/Other Assessments   Focus on Therapeutic Outcomes (FOTO)  [id number] [id number]      Posture/Postural Control   Posture/Postural Control No significant limitations      ROM / Strength   AROM / PROM / Strength AROM;PROM;Strength      AROM   Overall AROM Comments lumbar ROM is full      Strength   Overall Strength Comments bil. hip extension 4/5                      Objective measurements completed on examination: See above findings.     Pelvic Floor Special Questions - 12/07/20 0001    Prior Pregnancies Yes    Number of C-Sections 1   twins   Number of Vaginal Deliveries 2    Currently Sexually Active Yes    Is this Painful No    Urinary Leakage Yes    Pad use 1    Activities  that cause leaking With strong urge;Coughing;Sneezing;Laughing;Lifting;Walking    Urinary frequency every 2 hours    Fluid intake drinks alot of water    Skin Integrity Intact    Perineal Body/Introitus  Other   tight   Pelvic Floor Internal Exam Patient confirms identification and approves PT to assess pelvic floor and treatment with interpreter present    Exam Type Vaginal    Palpation tightness in the pubococcygeus    Strength weak squeeze, no lift   anterior is 0/5 and post. is 2/5                   PT Education - 12/07/20 1140    Education Details instructed patient in perineal massage to elongate the post. introitus muscle    Person(s) Educated Patient    Methods Explanation;Demonstration    Comprehension Verbalized understanding            PT Short Term Goals - 12/07/20 1146      PT SHORT TERM GOAL #1   Title independent with initial HEP    Baseline not educated yet    Time 4    Period Weeks    Status New    Target Date 01/04/21             PT Long Term Goals - 12/07/20 1146      PT LONG TERM  GOAL #1   Title independent with advanced HEP    Baseline not educated yet    Time 12    Period Weeks    Status New    Target Date 03/01/21      PT LONG TERM GOAL #2   Title able to contract her abdominals without bulging to work on her core strength    Baseline when contract abdominals will bulge the abdomen    Time 12    Period Weeks    Status New    Target Date 03/01/21      PT LONG TERM GOAL #3   Title pelvic floor strength is 3/5 with circular contraction and anterior and posterior contract to reduce urinary leakage >/= 75%    Time 12    Period Weeks    Status New    Target Date 03/01/21      PT LONG TERM GOAL #4   Title when patient has the urge to void will be able to walk to the commode without leaking urine    Baseline leaks urine as she walks to the commode    Time 12    Period Weeks    Status New    Target Date 03/01/21                  Plan - 12/07/20 1128    Clinical Impression Statement Patient is a 35 year old female with stress incontinence. She will leak urine with coughing, sneezing, laughing, when she has the urge, and lifting. Patient will wear 4 pantyliners per day. Patient C-section scar is slightly tight pubically. Patient has delivered twins with c-section and has had 2 vaginal deliveries. Patient urinates 10 times per day. Patient pelvic floor strength anteriorly is 0/5 and posteriorly is 2/5. She has tightness in the perineal body and puborectalis. Bilateral hip extension is 4/5. When she contracts her abdominals she will bulge them. Patient will benefit from skilled therapy to improve pelvic floor coordination and strength to reduce urinary leakage.    Personal Factors and Comorbidities Fitness;Comorbidity 1    Comorbidities 1 c-section  and 2 vaginal deliveries    Examination-Activity Limitations Continence;Lift    Stability/Clinical Decision Making Stable/Uncomplicated    Clinical Decision Making Low    Rehab Potential Excellent    PT  Frequency 1x / week    PT Duration 12 weeks    PT Treatment/Interventions ADLs/Self Care Home Management;Biofeedback;Therapeutic activities;Therapeutic exercise;Neuromuscular re-education;Manual techniques;Patient/family education;Dry needling    PT Next Visit Plan manual work to the pelvic floor to increase pelvic floor contration; abdominal contraction without bulging the pelvic floor; hip extension strength    Consulted and Agree with Plan of Care Patient;Other (Comment)   interpreter          Patient will benefit from skilled therapeutic intervention in order to improve the following deficits and impairments:  Decreased coordination,Increased fascial restricitons,Decreased strength,Decreased activity tolerance,Decreased endurance,Increased muscle spasms  Visit Diagnosis: Muscle weakness (generalized) - Plan: PT plan of care cert/re-cert  Other lack of coordination - Plan: PT plan of care cert/re-cert  Other muscle spasm - Plan: PT plan of care cert/re-cert  Stress incontinence (female) (female) - Plan: PT plan of care cert/re-cert     Problem List There are no problems to display for this patient.   Eulis Foster, PT 12/07/20 11:52 AM   [facility] [address] [place], [place], [address] Phone: [phone]   Fax:  [phone]  Name: Leslie Shaw MRN: [id number] Date of Birth: 1986-06-19

## 2020-12-12 ENCOUNTER — Ambulatory Visit: Payer: Medicaid Other | Admitting: Physical Therapy

## 2020-12-12 ENCOUNTER — Other Ambulatory Visit: Payer: Self-pay

## 2020-12-12 ENCOUNTER — Encounter: Payer: Self-pay | Admitting: Physical Therapy

## 2020-12-12 DIAGNOSIS — M6281 Muscle weakness (generalized): Secondary | ICD-10-CM

## 2020-12-12 DIAGNOSIS — R278 Other lack of coordination: Secondary | ICD-10-CM | POA: Diagnosis not present

## 2020-12-12 DIAGNOSIS — M62838 Other muscle spasm: Secondary | ICD-10-CM | POA: Diagnosis not present

## 2020-12-12 DIAGNOSIS — N393 Stress incontinence (female) (male): Secondary | ICD-10-CM

## 2020-12-12 NOTE — Therapy (Signed)
[facility] [address] [place], [place], [address] Phone: [phone]   Fax:  [phone]  Physical Therapy Treatment  Patient Details  Name: Leslie Shaw MRN: [id number] Date of Birth: 1986-09-18 Referring Provider (PT): Dr. Lanetta Inch   Encounter Date: 12/12/2020   PT End of Session - 12/12/20 1707    Visit Number 2    Date for PT Re-Evaluation 03/01/21    Authorization Type wellcare    Authorization Time Period 4/7-5/3    Authorization - Visit Number 1    Authorization - Number of Visits 4    PT Start Time 1615    PT Stop Time 1653    PT Time Calculation (min) 38 min    Activity Tolerance Patient tolerated treatment well    Behavior During Therapy [facility] for tasks assessed/performed           History reviewed. No pertinent past medical history.  Past Surgical History:  Procedure Laterality Date  . APPENDECTOMY    . CESAREAN SECTION      There were no vitals filed for this visit.   Subjective Assessment - 12/12/20 1618    Subjective I feel like I can tigthen the pelvic floor. She has been doing the manual work at home. The pad is not as wet.    Patient Stated Goals reduce leakage    Currently in Pain? No/denies    Multiple Pain Sites No                             [facility] Adult PT Treatment/Exercise - 12/12/20 0001      Lumbar Exercises: Supine   Ab Set 5 reps;5 seconds    AB Set Limitations with tactile cues to contract the abdominals    Bridge with clamshell 10 reps    Bridge with [employer] Limitations with pelvic floor contraction    Isometric Hip Flexion 20 reps;5 seconds    Isometric Hip Flexion Limitations then diagonal to work the obliques      Lumbar Exercises: Quadruped   Single Arm Raise Right;Left;10 reps;1 second    Single Arm Raise Weights (lbs) while squeeze mini ball    Other Quadruped Lumbar Exercises Quadruped with transverse abdominus contraction                   PT Education - 12/12/20 1707    Education Details Access Code: [email or web address]    Person(s) Educated Patient    Methods Explanation;Demonstration;Verbal cues;Handout    Comprehension Returned demonstration;Verbalized understanding            PT Short Term Goals - 12/12/20 1712      PT SHORT TERM GOAL #1   Title independent with initial HEP    Time 4    Period Weeks    Status Achieved             PT Long Term Goals - 12/07/20 1146      PT LONG TERM GOAL #1   Title independent with advanced HEP    Baseline not educated yet    Time 12    Period Weeks    Status New    Target Date 03/01/21      PT LONG TERM GOAL #2   Title able to contract her abdominals without bulging to work on her core strength    Baseline when contract abdominals will bulge the abdomen    Time  12    Period Weeks    Status New    Target Date 03/01/21      PT LONG TERM GOAL #3   Title pelvic floor strength is 3/5 with circular contraction and anterior and posterior contract to reduce urinary leakage >/= 75%    Time 12    Period Weeks    Status New    Target Date 03/01/21      PT LONG TERM GOAL #4   Title when patient has the urge to void will be able to walk to the commode without leaking urine    Baseline leaks urine as she walks to the commode    Time 12    Period Weeks    Status New    Target Date 03/01/21                 Plan - 12/12/20 1708    Clinical Impression Statement Patient reports her urinary leakage is better and pads are dryer. Patient had some fascial tightness in the lower abdomen and released after manual work. Patient is able to engage the abdominals but needs verbal cues to not push her lower abdominals outward. Patient has difficulty with lifting arms with abdominal contraction. Patient is doing her manual work on the pelvic floor and it is helping. Patient will benefit from skilled therapy to improve pelvic floor coordination and strength to  reduce urinary leakage.    Personal Factors and Comorbidities Fitness;Comorbidity 1    Comorbidities 1 c-section and 2 vaginal deliveries    Examination-Activity Limitations Continence;Lift    Stability/Clinical Decision Making Stable/Uncomplicated    Rehab Potential Excellent    PT Frequency 1x / week    PT Duration 12 weeks    PT Treatment/Interventions ADLs/Self Care Home Management;Biofeedback;Therapeutic activities;Therapeutic exercise;Neuromuscular re-education;Manual techniques;Patient/family education;Dry needling    PT Next Visit Plan progress HEP for pelvic floor strength with standing    PT Home Exercise Plan Access Code: [email or web address]    Recommended Other Services MD signed initial summary    Consulted and Agree with Plan of Care Patient;Other (Comment)           Patient will benefit from skilled therapeutic intervention in order to improve the following deficits and impairments:  Decreased coordination,Increased fascial restricitons,Decreased strength,Decreased activity tolerance,Decreased endurance,Increased muscle spasms  Visit Diagnosis: Muscle weakness (generalized)  Other lack of coordination  Other muscle spasm  Stress incontinence (female) (female)     Problem List There are no problems to display for this patient.   Eulis Foster, PT 12/12/20 5:13 PM   [facility] [address] [place], [place], [address] Phone: [phone]   Fax:  [phone]  Name: KAMBREE KRAUSS MRN: [id number] Date of Birth: March 30, 1986

## 2020-12-12 NOTE — Patient Instructions (Signed)
Access Code: [email or web address] URL: [email or web address] Date: 12/12/2020 Prepared by: Eulis Foster  Exercises Hooklying Transversus Abdominis Palpation - 1 x daily - 7 x weekly - 1 sets - 5 reps - 5 sec hold Hooklying Isometric Hip Flexion - 1 x daily - 7 x weekly - 1 sets - 10 reps - 5 sec hold Hooklying Isometric Hip Flexion with Opposite Arm - 1 x daily - 7 x weekly - 1 sets - 10 reps - 5 sec hold Supine Bridge with Mini Swiss Ball Between Knees - 1 x daily - 7 x weekly - 1 sets - 10 reps Quadruped Exhale with Pelvic Floor Contraction and Arm Raise - 1 x daily - 7 x weekly - 1 sets - 10 reps [facility] [address], [address] [place], [place] [address] Phone # [phone] Fax [phone]

## 2021-01-04 DIAGNOSIS — Z419 Encounter for procedure for purposes other than remedying health state, unspecified: Secondary | ICD-10-CM | POA: Diagnosis not present

## 2021-01-10 ENCOUNTER — Encounter: Payer: Medicaid Other | Admitting: Physical Therapy

## 2021-01-23 ENCOUNTER — Encounter: Payer: Medicaid Other | Admitting: Physical Therapy

## 2021-02-03 DIAGNOSIS — Z419 Encounter for procedure for purposes other than remedying health state, unspecified: Secondary | ICD-10-CM | POA: Diagnosis not present

## 2021-02-04 ENCOUNTER — Encounter: Payer: Self-pay | Admitting: Physical Therapy

## 2021-02-06 ENCOUNTER — Encounter: Payer: Medicaid Other | Admitting: Physical Therapy

## 2021-02-20 ENCOUNTER — Other Ambulatory Visit: Payer: Self-pay

## 2021-02-20 ENCOUNTER — Ambulatory Visit: Payer: Medicaid Other | Attending: Obstetrics and Gynecology | Admitting: Physical Therapy

## 2021-02-20 ENCOUNTER — Encounter: Payer: Self-pay | Admitting: Physical Therapy

## 2021-02-20 DIAGNOSIS — R278 Other lack of coordination: Secondary | ICD-10-CM | POA: Insufficient documentation

## 2021-02-20 DIAGNOSIS — M6281 Muscle weakness (generalized): Secondary | ICD-10-CM | POA: Diagnosis not present

## 2021-02-20 DIAGNOSIS — M62838 Other muscle spasm: Secondary | ICD-10-CM | POA: Insufficient documentation

## 2021-02-20 DIAGNOSIS — N393 Stress incontinence (female) (male): Secondary | ICD-10-CM | POA: Diagnosis not present

## 2021-02-20 NOTE — Patient Instructions (Signed)
Access Code: [email or web address] URL: [email or web address] Date: 02/20/2021 Prepared by: Eulis Foster  Exercises Hooklying Transversus Abdominis Palpation - 1 x daily - 3 x weekly - 1 sets - 5 reps - 5 sec hold Hooklying Isometric Hip Flexion - 1 x daily - 3 x weekly - 1 sets - 10 reps - 5 sec hold Hooklying Isometric Hip Flexion with Opposite Arm - 1 x daily - 3 x weekly - 1 sets - 10 reps - 5 sec hold Supine Bridge with Mini Swiss Ball Between Knees - 1 x daily - 3 x weekly - 1 sets - 10 reps Quadruped Exhale with Pelvic Floor Contraction and Arm Raise - 1 x daily - 3 x weekly - 1 sets - 10 reps Supine Pelvic Floor Contraction - 2 x daily - 7 x weekly - 1 sets - 5 reps - 5 sec hold Seated Pelvic Floor Contraction - 3 x daily - 7 x weekly - 1 sets - 5 reps - 5 sec hold  Eulis Foster, PT [facility] [address] [place], [place] [address] W: [phone] [email or web address]

## 2021-02-20 NOTE — Therapy (Signed)
[facility] [address] [place], [place], [address] Phone: [phone]   Fax:  [phone]  Physical Therapy Treatment  Patient Details  Name: Leslie Shaw MRN: [id number] Date of Birth: 12-29-1985 Referring Provider (PT): Dr. Lanetta Inch   Encounter Date: 02/20/2021   PT End of Session - 02/20/21 1024    Visit Number 3    Date for PT Re-Evaluation 03/01/21    Authorization Type wellcare    Authorization Time Period 4/7-5/3    PT Start Time 1015    PT Stop Time 1055    PT Time Calculation (min) 40 min    Activity Tolerance Patient tolerated treatment well    Behavior During Therapy [facility] for tasks assessed/performed           History reviewed. No pertinent past medical history.  Past Surgical History:  Procedure Laterality Date  . APPENDECTOMY    . CESAREAN SECTION      There were no vitals filed for this visit.   Subjective Assessment - 02/20/21 1019    Subjective I am better. When I miss my exercises for 2 days I will leak.    Patient Stated Goals reduce leakage    Currently in Pain? No/denies                          Pelvic Floor Special Questions - 02/20/21 0001    Pelvic Floor Internal Exam Patient confirms identification and approves PT to assess pelvic floor and treatment with interpreter present    Exam Type Vaginal             [facility] Adult PT Treatment/Exercise - 02/20/21 0001      Neuro Re-ed    Neuro Re-ed Details  pelvic floor contraction with tactile cues for a circular contraction and full relaxation      Manual Therapy   Manual Therapy Internal Pelvic Floor    Internal Pelvic Floor introitus, left side to the bladder, along the perineal body, duperior transverse perineum, to elongate the tissue for improve contraction                  PT Education - 02/20/21 1103    Education Details Access Code: [email or web address]    Person(s) Educated Patient    Methods  Explanation;Demonstration;Verbal cues;Handout    Comprehension Returned demonstration;Verbalized understanding            PT Short Term Goals - 12/12/20 1712      PT SHORT TERM GOAL #1   Title independent with initial HEP    Time 4    Period Weeks    Status Achieved             PT Long Term Goals - 02/20/21 1020      PT LONG TERM GOAL #1   Title independent with advanced HEP    Time 12    Period Weeks    Status On-going      PT LONG TERM GOAL #3   Title pelvic floor strength is 3/5 with circular contraction and anterior and posterior contract to reduce urinary leakage >/= 75%    Baseline sneezing    Time 12    Period Weeks    Status On-going      PT LONG TERM GOAL #4   Title when patient has the urge to void will be able to walk to the commode without leaking urine    Time  12    Period Weeks    Status Achieved                 Plan - 02/20/21 1024    Clinical Impression Statement Patient has improved pelvic floor strength to 3/5 holding for 5 seconds. Patient has tightness in the perineal body, along the superior transverse perineum, along the introitus and left side of the bladder. Patient is able to do a quick flick with full relaxation of the pelvic floor. Patient will leak when she does not do her exercises daily. Patient will benefit from skilled therapy to improve pelvic floor strength and reduce leakage.    Personal Factors and Comorbidities Fitness;Comorbidity 1    Comorbidities 1 c-section and 2 vaginal deliveries    Examination-Activity Limitations Continence;Lift    Stability/Clinical Decision Making Stable/Uncomplicated    Rehab Potential Excellent    PT Frequency 1x / week    PT Duration 12 weeks    PT Treatment/Interventions ADLs/Self Care Home Management;Biofeedback;Therapeutic activities;Therapeutic exercise;Neuromuscular re-education;Manual techniques;Patient/family education;Dry needling    PT Next Visit Plan progress HEP for pelvic floor  strength with standing    PT Home Exercise Plan Access Code: [email or web address]    Consulted and Agree with Plan of Care Patient;Other (Comment)           Patient will benefit from skilled therapeutic intervention in order to improve the following deficits and impairments:  Decreased coordination,Increased fascial restricitons,Decreased strength,Decreased activity tolerance,Decreased endurance,Increased muscle spasms  Visit Diagnosis: Muscle weakness (generalized)  Other lack of coordination  Other muscle spasm  Stress incontinence (female) (female)     Problem List There are no problems to display for this patient.   Eulis Foster, PT 02/20/21 11:07 AM   [facility] [address] [place], [place], [address] Phone: [phone]   Fax:  [phone]  Name: Leslie Shaw MRN: [id number] Date of Birth: 1986-04-03

## 2021-02-25 DIAGNOSIS — Z13220 Encounter for screening for lipoid disorders: Secondary | ICD-10-CM | POA: Diagnosis not present

## 2021-02-25 DIAGNOSIS — Z13228 Encounter for screening for other metabolic disorders: Secondary | ICD-10-CM | POA: Diagnosis not present

## 2021-02-25 DIAGNOSIS — R5383 Other fatigue: Secondary | ICD-10-CM | POA: Diagnosis not present

## 2021-02-27 ENCOUNTER — Encounter: Payer: Self-pay | Admitting: Physical Therapy

## 2021-02-27 ENCOUNTER — Ambulatory Visit: Payer: Medicaid Other | Admitting: Physical Therapy

## 2021-02-27 ENCOUNTER — Other Ambulatory Visit: Payer: Self-pay

## 2021-02-27 DIAGNOSIS — M6281 Muscle weakness (generalized): Secondary | ICD-10-CM

## 2021-02-27 DIAGNOSIS — R278 Other lack of coordination: Secondary | ICD-10-CM | POA: Diagnosis not present

## 2021-02-27 DIAGNOSIS — M62838 Other muscle spasm: Secondary | ICD-10-CM

## 2021-02-27 DIAGNOSIS — N393 Stress incontinence (female) (male): Secondary | ICD-10-CM

## 2021-02-27 NOTE — Therapy (Addendum)
[facility] [address], [place], [place], [address] Phone: [phone]   Fax:  [phone]  Physical Therapy Treatment  Patient Details  Name: Leslie CHAVERO MRN: [id number] Date of Birth: 12/30/1985 Referring Provider (PT): Dr. Sherlene Shams   Encounter Date: 02/27/2021   PT End of Session - 02/27/21 1056     Visit Number 4    Date for PT Re-Evaluation 05/22/21    Authorization Type [employer]    PT Start Time 1015    PT Stop Time 1054    PT Time Calculation (min) 39 min    Activity Tolerance Patient tolerated treatment well    Behavior During Therapy [facility] for tasks assessed/performed             History reviewed. No pertinent past medical history.  Past Surgical History:  Procedure Laterality Date   APPENDECTOMY     CESAREAN SECTION      There were no vitals filed for this visit.   Subjective Assessment - 02/27/21 1018     Subjective I am better. Leak when coughs. I have been doing my exercises for 4 days per week.    Patient Stated Goals reduce leakage    Currently in Pain? No/denies                [facility] PT Assessment - 02/27/21 0001       Assessment   Medical Diagnosis N39.3 STress incontinence    Referring Provider (PT) Dr. Sherlene Shams    Onset Date/Surgical Date --   2018   Prior Therapy none      Precautions   Precautions None      Restrictions   Weight Bearing Restrictions No      Home Environment   Living Environment Private residence      Prior Function   Level of Independence Independent    Vocation Requirements sitting    Leisure none      Cognition   Overall Cognitive Status Within Functional Limits for tasks assessed      Observation/Other Assessments   Focus on Therapeutic Outcomes (FOTO)  UIQ-7 33      Posture/Postural Control   Posture/Postural Control No significant limitations      AROM   Overall AROM Comments lumbar ROM is full       Strength   Overall Strength Comments left leg 4/5 and right 5/5                        Pelvic Floor Special Questions - 02/27/21 0001     Number of C-Sections 1   twins   Number of Vaginal Deliveries 2    Currently Sexually Active Yes    Is this Painful No    Urinary Leakage Yes    Pad use 1    Activities that cause leaking Coughing;Lifting;Walking    Urinary frequency every 2 hours    Fluid intake drinks alot of water    Pelvic Floor Internal Exam Patient confirms identification and approves PT to assess pelvic floor and treatment with interpreter present    Exam Type Vaginal    Palpation tightness in the perineal bodu, pubor rectalis and post. wall of introisut    Strength weak squeeze, no lift               [facility] Adult PT Treatment/Exercise - 02/27/21 0001       Neuro Re-ed    Neuro Re-ed  Details  diaphragmatic breathing to elongate the pelvic floor muscles      Manual Therapy   Manual Therapy Internal Pelvic Floor    Manual therapy comments educated patient on how to massage her pelvic floor muscles at home    Internal Pelvic Floor manual work to the perineal body, puborectalis, and posterior aspect of the pelvic floor while monitoring for pain                      PT Short Term Goals - 02/27/21 1022       PT SHORT TERM GOAL #1   Title independent with initial HEP    Baseline educated    Time 4    Status Achieved               PT Long Term Goals - 02/27/21 1023       PT LONG TERM GOAL #1   Title independent with advanced HEP    Baseline stilll being educated as she is progressing    Time 12    Period Weeks    Status On-going      PT LONG TERM GOAL #2   Title able to contract her abdominals without bulging to work on her core strength    Baseline when contract abdominals will bulge the abdomen    Time 12    Period Weeks    Status Achieved      PT LONG TERM GOAL #3   Title pelvic floor strength is 3/5 with circular  contraction and anterior and posterior contract to reduce urinary leakage >/= 75%    Baseline leak urine with cough, lifting heavy item    Time 12    Period Weeks    Status On-going      PT LONG TERM GOAL #4   Title when patient has the urge to void will be able to walk to the commode without leaking urine    Baseline does not leak when walking to the bathroom    Time 12    Period Weeks    Status Achieved                   Plan - 02/27/21 1034     Clinical Impression Statement Patient reports she will leak urine with coughing, lifting, driving around to the stores and when she is cleaning her home. She is not leaking anymore with sneezing or laughing. When she has the urge she is able to walk to the bathroom without leaking. Patient is not bulging her abdominal now with contraction but strength os 2/5. Patient left hip strength continues to be 4/5 but the right increased to 5/5. UIQ-7 went from 29 to 33. Patient pelvic floor strength is 2/5 and she has tightness in the introitus and puborectalis. Patient would benefit from skilled therapy to improve tissue mobility of the vaginal muscles to contract correctly to reduce leakage.    Personal Factors and Comorbidities Fitness;Comorbidity 1    Comorbidities 1 c-section and 2 vaginal deliveries    Examination-Activity Limitations Continence;Lift    Stability/Clinical Decision Making Stable/Uncomplicated    Rehab Potential Excellent    PT Frequency 1x / week    PT Duration 12 weeks    PT Treatment/Interventions ADLs/Self Care Home Management;Biofeedback;Therapeutic activities;Therapeutic exercise;Neuromuscular re-education;Manual techniques;Patient/family education;Dry needling    PT Next Visit Plan progress HEP for pelvic floor strength with standing    PT Home Exercise Plan Access Code: [email or web address]    [email or web address]  and Agree with Plan of Care Patient             Patient will benefit from skilled therapeutic intervention in order to  improve the following deficits and impairments:  Decreased coordination,Increased fascial restricitons,Decreased strength,Decreased activity tolerance,Decreased endurance,Increased muscle spasms  Visit Diagnosis: Muscle weakness (generalized) - Plan: PT plan of care cert/re-cert  Other lack of coordination - Plan: PT plan of care cert/re-cert  Other muscle spasm - Plan: PT plan of care cert/re-cert  Stress incontinence (female) (female) - Plan: PT plan of care cert/re-cert     Problem List There are no problems to display for this patient.   Earlie Counts, PT 02/27/21 10:59 AM   [facility] [address] [place], [place], [address] Phone: [phone]   Fax:  [phone]  Name: Leslie Shaw MRN: [id number] Date of Birth: 11/14/1985  PHYSICAL THERAPY DISCHARGE SUMMARY  Visits from Start of Care: 4  Current functional level related to goals / functional outcomes: See above.    Remaining deficits: See above.    Education / Equipment: HEP   Patient agrees to discharge. Patient goals were partially met. Patient is being discharged due to maximized rehab potential.  Thank you for the referral. Earlie Counts, PT 05/21/21 1:56 PM

## 2021-03-05 DIAGNOSIS — R946 Abnormal results of thyroid function studies: Secondary | ICD-10-CM | POA: Diagnosis not present

## 2021-03-05 DIAGNOSIS — R5383 Other fatigue: Secondary | ICD-10-CM | POA: Diagnosis not present

## 2021-03-06 DIAGNOSIS — Z419 Encounter for procedure for purposes other than remedying health state, unspecified: Secondary | ICD-10-CM | POA: Diagnosis not present

## 2021-03-12 ENCOUNTER — Other Ambulatory Visit: Payer: Self-pay | Admitting: Family Medicine

## 2021-03-14 ENCOUNTER — Other Ambulatory Visit: Payer: Self-pay | Admitting: Family Medicine

## 2021-03-14 DIAGNOSIS — E042 Nontoxic multinodular goiter: Secondary | ICD-10-CM

## 2021-03-18 ENCOUNTER — Ambulatory Visit
Admission: RE | Admit: 2021-03-18 | Discharge: 2021-03-18 | Disposition: A | Discharge status: Home / Self Care | Payer: Medicaid Other | Source: Ambulatory Visit | Attending: Family Medicine | Admitting: Family Medicine

## 2021-03-18 DIAGNOSIS — E049 Nontoxic goiter, unspecified: Secondary | ICD-10-CM | POA: Diagnosis not present

## 2021-03-18 DIAGNOSIS — E042 Nontoxic multinodular goiter: Secondary | ICD-10-CM

## 2021-04-05 DIAGNOSIS — Z419 Encounter for procedure for purposes other than remedying health state, unspecified: Secondary | ICD-10-CM | POA: Diagnosis not present

## 2021-04-29 ENCOUNTER — Ambulatory Visit (INDEPENDENT_AMBULATORY_CARE_PROVIDER_SITE_OTHER): Payer: Medicaid Other | Admitting: Obstetrics and Gynecology

## 2021-04-29 ENCOUNTER — Encounter: Payer: Self-pay | Admitting: Obstetrics and Gynecology

## 2021-04-29 ENCOUNTER — Other Ambulatory Visit: Payer: Self-pay

## 2021-04-29 VITALS — BP 104/71 | HR 75 | Ht 64.0 in | Wt 117.7 lb

## 2021-04-29 DIAGNOSIS — Z3009 Encounter for other general counseling and advice on contraception: Secondary | ICD-10-CM

## 2021-04-29 DIAGNOSIS — N393 Stress incontinence (female) (male): Secondary | ICD-10-CM

## 2021-04-29 MED ORDER — NORETHIN ACE-ETH ESTRAD-FE 1-20 MG-MCG(24) PO TABS: 1.0000 | ORAL_TABLET | Freq: Every day | ORAL | 11 refills | Status: DC

## 2021-04-29 NOTE — Progress Notes (Signed)
35 yo P3 here for contraception counseling. Patient previously using Nuvaring and reports an episode of brown discharge last month which resolved and also reports that her husband can feel it. Patient desires to start birth control pills. Patient also states that although her urinary incontinence has improved with physical therapy, it is still present. She also states that her husband mentioned that her vagina is "too big" and a surgical option may help correct her vagina and urinary incontinence  History reviewed. No pertinent past medical history. Past Surgical History:  Procedure Laterality Date   APPENDECTOMY     CESAREAN SECTION     History reviewed. No pertinent family history.  Social History   Tobacco Use   Smoking status: Never   Smokeless tobacco: Never  Vaping Use   Vaping Use: Never used  Substance Use Topics   Alcohol use: No   Drug use: No   ROS See pertinent in HPI. All other systems reviewed and non contributory Blood pressure 104/71, pulse 75, height 5\' 4"  (1.626 m), weight 117 lb 11.2 oz (53.4 kg), last menstrual period 04/04/2021, currently breastfeeding. GENERAL: Well-developed, well-nourished female in no acute distress.  ABDOMEN: Soft, nontender, nondistended. No organomegaly. PELVIC: Normal external female genitalia. Vagina is pink and rugated.  Normal discharge. Normal appearing cervix. Uterus is normal in size.  No adnexal mass or tenderness. EXTREMITIES: No cyanosis, clubbing, or edema, 2+ distal pulses.  A/P 35 yo with urinary incontinence and desire for birth control pills - Rx COC provided - No evidence of pelvic organ prolapse noted on exam and discussed that surgical interventions are not indicated - Discussed with patient that given that physical therapy has improved her incontinence, perhaps more time is needed to see a complete resolution of symptoms - Patient desire to be seen by urogynecology again - Patient with normal pap smear in 2021 -  Patient declined STI testing - RTC prn

## 2021-04-29 NOTE — Progress Notes (Signed)
Removed nuvaring yesterday. Wants to discuss a different method of BC. Reports some bleeding in between periods. Wants to discuss uterine vaginal prolapse repair.

## 2021-05-06 DIAGNOSIS — Z419 Encounter for procedure for purposes other than remedying health state, unspecified: Secondary | ICD-10-CM | POA: Diagnosis not present

## 2021-06-06 DIAGNOSIS — Z419 Encounter for procedure for purposes other than remedying health state, unspecified: Secondary | ICD-10-CM | POA: Diagnosis not present

## 2021-07-02 DIAGNOSIS — N898 Other specified noninflammatory disorders of vagina: Secondary | ICD-10-CM | POA: Diagnosis not present

## 2021-07-02 DIAGNOSIS — N939 Abnormal uterine and vaginal bleeding, unspecified: Secondary | ICD-10-CM | POA: Diagnosis not present

## 2021-07-02 DIAGNOSIS — R5383 Other fatigue: Secondary | ICD-10-CM | POA: Diagnosis not present

## 2021-07-06 DIAGNOSIS — Z419 Encounter for procedure for purposes other than remedying health state, unspecified: Secondary | ICD-10-CM | POA: Diagnosis not present

## 2021-07-11 NOTE — Progress Notes (Signed)
[facility] Return Visit  SUBJECTIVE  History of Present Illness: Leslie Shaw is a 35 y.o. female seen in follow-up for incontinence. Plan at last visit was to attend physical therapy. She completed PT and initially had some improvement with incontinence but now has returned.   Leaks urine with coughing, lifting. This is bothersome for her. Sometimes will have leakage when bladder is full and she is driving. Also feels some discomfort in the vaginal area, feels it is related to her birth control. She was also told by her husband that she feels "loose" and is wondering if something can be done.   Drinks: 1-2 cups coffee in AM, otherwise only water  Not planning on more children.   Past Medical History: Patient  has no past medical history on file.   Past Surgical History: She  has a past surgical history that includes Cesarean section and Appendectomy.   Medications: She has a current medication list which includes the following prescription(s): norethindrone acetate-ethinyl estrad-fe and [DISCONTINUED] norgestrel-ethinyl estradiol.   Allergies: Patient is allergic to other, pistachio nut (diagnostic), and shrimp [shellfish allergy].   Social History: Patient  reports that she has never smoked. She has never used smokeless tobacco. She reports that she does not drink alcohol and does not use drugs.      OBJECTIVE     Physical Exam: Vitals:   07/12/21 0941  BP: 117/80  Pulse: 94  Weight: 115 lb (52.2 kg)   Gen: No apparent distress, A&O x 3.  Detailed Urogynecologic Evaluation:   CST negative  Normal external genitalia and urethra. Speculum exam reveals normal vaginal mucosa, normal appearing cervix with slight yellow discharge present.   POP-Q  -3                                            Aa   -3                                           Ba  -6                                              C   3                                            Gh  2.5                                             Pb  7                                            tvl   -3  Ap  -3                                            Bp  -6.5                                              D   Pelvic floor muscle strength 1/5   ASSESSMENT AND PLAN    Leslie Shaw is a 35 y.o. with:  1. SUI (stress urinary incontinence, female)   2. Overactive bladder    - For treatment of stress urinary incontinence,  non-surgical options include expectant management, weight loss, physical therapy, as well as a pessary.  Surgical options include a midurethral sling, Burch urethropexy, and transurethral injection of a bulking agent. - She is interested in a surgical option for leakage. Handouts provided on options.  - She will need to undergo urodynamic testing. She complains of leakage out of nowhere which makes me suspect a component of overactive bladder as well.  - No sign of prolapse on exam today. We discussed performing pelvic floor muscle exercises to improve pelvic floor strength, which can help with sensation during intercourse. We reviewed there is no surgical procedure to improve this.   Return for UDS   Marguerita Beards, MD  Time spent: I spent 30 minutes dedicated to the care of this patient on the date of this encounter to include pre-visit review of records, face-to-face time with the patient discussing surgical options and post visit documentation and ordering medication/ testing.

## 2021-07-12 ENCOUNTER — Encounter: Payer: Self-pay | Admitting: Obstetrics and Gynecology

## 2021-07-12 ENCOUNTER — Ambulatory Visit (INDEPENDENT_AMBULATORY_CARE_PROVIDER_SITE_OTHER): Payer: Medicaid Other | Admitting: Obstetrics and Gynecology

## 2021-07-12 ENCOUNTER — Other Ambulatory Visit: Payer: Self-pay

## 2021-07-12 VITALS — BP 117/80 | HR 94 | Wt 115.0 lb

## 2021-07-12 DIAGNOSIS — N3281 Overactive bladder: Secondary | ICD-10-CM

## 2021-07-12 DIAGNOSIS — N393 Stress incontinence (female) (male): Secondary | ICD-10-CM | POA: Diagnosis not present

## 2021-08-06 DIAGNOSIS — Z419 Encounter for procedure for purposes other than remedying health state, unspecified: Secondary | ICD-10-CM | POA: Diagnosis not present

## 2021-08-15 DIAGNOSIS — N390 Urinary tract infection, site not specified: Secondary | ICD-10-CM | POA: Diagnosis not present

## 2021-09-05 DIAGNOSIS — Z419 Encounter for procedure for purposes other than remedying health state, unspecified: Secondary | ICD-10-CM | POA: Diagnosis not present

## 2021-09-10 DIAGNOSIS — Z13220 Encounter for screening for lipoid disorders: Secondary | ICD-10-CM | POA: Diagnosis not present

## 2021-09-10 DIAGNOSIS — Z13228 Encounter for screening for other metabolic disorders: Secondary | ICD-10-CM | POA: Diagnosis not present

## 2021-09-10 DIAGNOSIS — Z0000 Encounter for general adult medical examination without abnormal findings: Secondary | ICD-10-CM | POA: Diagnosis not present

## 2021-10-06 DIAGNOSIS — Z419 Encounter for procedure for purposes other than remedying health state, unspecified: Secondary | ICD-10-CM | POA: Diagnosis not present

## 2021-11-06 DIAGNOSIS — Z419 Encounter for procedure for purposes other than remedying health state, unspecified: Secondary | ICD-10-CM | POA: Diagnosis not present

## 2021-12-04 DIAGNOSIS — Z419 Encounter for procedure for purposes other than remedying health state, unspecified: Secondary | ICD-10-CM | POA: Diagnosis not present

## 2021-12-13 DIAGNOSIS — M62830 Muscle spasm of back: Secondary | ICD-10-CM | POA: Diagnosis not present

## 2021-12-19 DIAGNOSIS — X58XXXA Exposure to other specified factors, initial encounter: Secondary | ICD-10-CM | POA: Diagnosis not present

## 2021-12-19 DIAGNOSIS — M79644 Pain in right finger(s): Secondary | ICD-10-CM | POA: Diagnosis not present

## 2021-12-19 DIAGNOSIS — S60412A Abrasion of right middle finger, initial encounter: Secondary | ICD-10-CM | POA: Diagnosis not present

## 2021-12-19 DIAGNOSIS — M25571 Pain in right ankle and joints of right foot: Secondary | ICD-10-CM | POA: Diagnosis not present

## 2022-01-04 DIAGNOSIS — Z419 Encounter for procedure for purposes other than remedying health state, unspecified: Secondary | ICD-10-CM | POA: Diagnosis not present

## 2022-02-03 DIAGNOSIS — Z419 Encounter for procedure for purposes other than remedying health state, unspecified: Secondary | ICD-10-CM | POA: Diagnosis not present

## 2022-03-06 DIAGNOSIS — Z419 Encounter for procedure for purposes other than remedying health state, unspecified: Secondary | ICD-10-CM | POA: Diagnosis not present

## 2022-04-05 DIAGNOSIS — Z419 Encounter for procedure for purposes other than remedying health state, unspecified: Secondary | ICD-10-CM | POA: Diagnosis not present

## 2022-05-06 DIAGNOSIS — Z419 Encounter for procedure for purposes other than remedying health state, unspecified: Secondary | ICD-10-CM | POA: Diagnosis not present

## 2022-06-06 DIAGNOSIS — Z419 Encounter for procedure for purposes other than remedying health state, unspecified: Secondary | ICD-10-CM | POA: Diagnosis not present

## 2022-06-30 DIAGNOSIS — S60352A Superficial foreign body of left thumb, initial encounter: Secondary | ICD-10-CM | POA: Diagnosis not present

## 2022-07-06 DIAGNOSIS — Z419 Encounter for procedure for purposes other than remedying health state, unspecified: Secondary | ICD-10-CM | POA: Diagnosis not present

## 2022-07-11 ENCOUNTER — Ambulatory Visit (INDEPENDENT_AMBULATORY_CARE_PROVIDER_SITE_OTHER): Payer: Medicaid Other | Admitting: Obstetrics & Gynecology

## 2022-07-11 ENCOUNTER — Encounter: Payer: Self-pay | Admitting: Obstetrics and Gynecology

## 2022-07-11 VITALS — BP 94/66 | HR 75 | Wt 128.4 lb

## 2022-07-11 DIAGNOSIS — N644 Mastodynia: Secondary | ICD-10-CM

## 2022-07-11 NOTE — Progress Notes (Signed)
   GYNECOLOGY OFFICE VISIT NOTE  History:   Leslie Shaw is a 36 y.o. [phone] here today for evaluation of left breast pain for over a month.  Accompanied by her husband. No pain today, but she is worried about its long duration. Did not feel any masses or see any drainage, no fevers.  She denies any abnormal vaginal discharge, bleeding, pelvic pain or other concerns.    History reviewed. No pertinent past medical history.  Past Surgical History:  Procedure Laterality Date   APPENDECTOMY     CESAREAN SECTION      The following portions of the patient's history were reviewed and updated as appropriate: allergies, current medications, past family history, past medical history, past social history, past surgical history and problem list.   Health Maintenance:  Normal pap and negative HRHPV on 03/14/2020.     Review of Systems:  Pertinent items noted in HPI and remainder of comprehensive ROS otherwise negative.  Physical Exam:  BP 94/66   Pulse 75   Wt 128 lb 6.4 oz (58.2 kg)   LMP 07/04/2022 (Exact Date)   BMI 22.04 kg/m  CONSTITUTIONAL: Well-developed, well-nourished female in no acute distress.  SKIN: No rash noted. Not diaphoretic. No erythema. No pallor. MUSCULOSKELETAL: Normal range of motion. No edema noted. NEUROLOGIC: Alert and oriented to person, place, and time. Normal muscle tone coordination. No cranial nerve deficit noted. PSYCHIATRIC: Normal mood and affect. Normal behavior. Normal judgment and thought content. CARDIOVASCULAR: Normal heart rate noted RESPIRATORY: Effort and breath sounds normal, no problems with respiration noted BREASTS: Symmetric in size. No masses, skin changes, nipple drainage, or lymphadenopathy bilaterally. Mild tenderness on palpation of left breast diffusely, none on right side. ABDOMEN: No masses noted. No other overt distention noted.   PELVIC: Deferred     Assessment and Plan:    1. Breast pain in female on left No focal examination  findings, discussed that common breast pain is usually hormonal and can be intermittent but patient and her husband desires breast imaging for further evaluation. Studies ordered, will follow up results and manage accordingly. In the meantime, recommended Tylenol or Ibuprofen as needed if pain recurs. Also gave information about breast pain. - US BREAST LTD UNI LEFT INC AXILLA; Future - MM DIAG BREAST TOMO BILATERAL; Future  Routine preventative health maintenance measures emphasized. Please refer to After Visit Summary for other counseling recommendations.   Return for any gynecologic concerns.    I spent 30 minutes dedicated to the care of this patient including pre-visit review of records, face to face time with the patient discussing her conditions and treatments and post visit orders.    Verita Schneiders, MD, [place] for [employer], [place]

## 2022-07-11 NOTE — Patient Instructions (Signed)
BREAST PAIN OR TENDERNESS  The most common type of breast pain is caused by the hormones that control the menstrual period. These hormonal changes can cause pain in both breasts several days before the menstrual period begins. Because the pain can come and go with the menstrual cycle, it is called "cyclical" breast pain. Cyclical breast pain is not usually caused by breast cancer or other serious breast problems. Breast pain in the perimenopausal years may also be related to hormonal changes.  Less commonly, a woman can have breast pain that does not come and go with the menstrual cycle (also called noncyclical breast pain). This type of pain is not related to the menstrual cycle and might occur in only one breast or one area of the breast. Noncyclical breast pain is usually caused by a problem outside the breast, such as muscle or connective tissue strain, skin injury, spinal conditions, or problems in another organ system (eg, heart burn, chest pain). Noncyclical breast pain is caused by breast cancer in only a very small percentage of women.  If you are worried about breast pain, speak to your healthcare provider to determine if you need further testing. If testing shows no signs of a serious problem, you can try one or more of the following treatments:  ?Pain relief medicines, such as acetaminophen (Tylenol and others) or ibuprofen (Advil, Motrin, and others). Women with very severe breast pain are sometimes treated with a prescription medicine.  ?Decrease the dose or stop taking medicines that contain estrogen (after a discussion with your healthcare provider).  ?Wear a well-fitted support or sports bra.  ?Consider making changes to your diet. Elimination of caffeine and a low-fat, high-complex-carbohydrate diet is helpful for some women. Dietary supplements such as vitamin E and evening primrose oil have also been suggested for breast pain; however, there is no proof that these are effective.

## 2022-07-11 NOTE — Progress Notes (Signed)
Patient presents with pain in her left breast. Says it has been hurting for approx 1 month. Denies swelling, redness, drainage. Pt says it does not currently hurt. No other concerns at this time.

## 2022-07-18 ENCOUNTER — Ambulatory Visit
Admission: RE | Admit: 2022-07-18 | Discharge: 2022-07-18 | Disposition: A | Discharge status: Home / Self Care | Payer: Medicaid Other | Source: Ambulatory Visit | Attending: Obstetrics & Gynecology | Admitting: Obstetrics & Gynecology

## 2022-07-18 ENCOUNTER — Ambulatory Visit: Admission: RE | Admit: 2022-07-18 | Payer: Medicaid Other | Source: Ambulatory Visit

## 2022-07-18 DIAGNOSIS — N644 Mastodynia: Secondary | ICD-10-CM

## 2022-07-28 ENCOUNTER — Encounter: Payer: Self-pay | Admitting: *Deleted

## 2022-07-30 ENCOUNTER — Encounter: Payer: Self-pay | Admitting: Physician Assistant

## 2022-07-30 ENCOUNTER — Ambulatory Visit (INDEPENDENT_AMBULATORY_CARE_PROVIDER_SITE_OTHER): Payer: Medicaid Other | Admitting: Physician Assistant

## 2022-07-30 VITALS — BP 108/74 | HR 79 | Temp 97.5°F | Ht 64.0 in | Wt 132.0 lb

## 2022-07-30 DIAGNOSIS — R946 Abnormal results of thyroid function studies: Secondary | ICD-10-CM

## 2022-07-30 DIAGNOSIS — R202 Paresthesia of skin: Secondary | ICD-10-CM | POA: Diagnosis not present

## 2022-07-30 NOTE — Progress Notes (Signed)
Subjective:    Patient ID: Leslie Shaw, female    DOB: [birth year], 36 y.o.   MRN: [id number]  Chief Complaint  Patient presents with   New Patient (Initial Visit)    Pt states she wants bloodwork thyroid. Pt also has numbness in hands and feet, but sometimes more numbness in lt hand. Also more numbness in lt foot.     HPI 35 y.o. patient presents today for new patient establishment with me.  Patient was previously established with Dr. Ayesha Rumpf.  Current Care Team: [facility]   Acute Concerns: Needs thyroid test repeated - some kind of abnormality last year  R hand dominant.  In the last month has noticed numbness in left hand and foot, random, intermittent. No known injury; occasionally R sided as well, but mostly left. No pain. No repetitive activities, unsure of cause. No loss in grip.    History reviewed. No pertinent past medical history.  Past Surgical History:  Procedure Laterality Date   APPENDECTOMY     CESAREAN SECTION      History reviewed. No pertinent family history.  Social History   Tobacco Use   Smoking status: Never   Smokeless tobacco: Never  Vaping Use   Vaping Use: Never used  Substance Use Topics   Alcohol use: No   Drug use: No     Allergies  Allergen Reactions   Other Swelling    Tree nuts- pine cone   Pistachio Nut (Diagnostic) Swelling   Shrimp [Shellfish Allergy] Swelling    Review of Systems NEGATIVE UNLESS OTHERWISE INDICATED IN HPI      Objective:     BP 108/74 (BP Location: Left Arm, Patient Position: Sitting)   Pulse 79   Temp (!) 97.5 F (36.4 C) (Temporal)   Ht 5\' 4"  (1.626 m)   Wt 132 lb (59.9 kg)   LMP 07/04/2022 (Exact Date)   SpO2 97%   BMI 22.66 kg/m   Wt Readings from Last 3 Encounters:  07/30/22 132 lb (59.9 kg)  07/11/22 128 lb 6.4 oz (58.2 kg)  07/12/21 115 lb (52.2 kg)    BP Readings from Last 3 Encounters:  07/30/22 108/74  07/11/22 94/66  07/12/21 117/80      Physical Exam Vitals and nursing note reviewed.  Constitutional:      Appearance: Normal appearance. She is normal weight. She is not toxic-appearing.  HENT:     Head: Normocephalic and atraumatic.  Eyes:     Extraocular Movements: Extraocular movements intact.     Conjunctiva/sclera: Conjunctivae normal.     Pupils: Pupils are equal, round, and reactive to light.  Cardiovascular:     Rate and Rhythm: Normal rate and regular rhythm.     Pulses: Normal pulses.     Heart sounds: Normal heart sounds.  Pulmonary:     Effort: Pulmonary effort is normal.     Breath sounds: Normal breath sounds.  Musculoskeletal:        General: Normal range of motion.     Cervical back: Normal range of motion and neck supple.     Right lower leg: No edema.     Left lower leg: No edema.  Skin:    General: Skin is warm and dry.     Findings: No rash.  Neurological:     General: No focal deficit present.     Mental Status: She is alert and oriented to person, place, and time.     Cranial  Nerves: No cranial nerve deficit.     Sensory: No sensory deficit.     Motor: No weakness.     Gait: Gait normal.     Comments: N/V intact, no loss in grip strength   Psychiatric:        Mood and Affect: Mood normal.        Assessment & Plan:  Abnormal thyroid function test Assessment & Plan: Plan to recheck thyroid function, treat any abnormal results  Orders: -     Thyroid Panel With TSH  Paresthesias Assessment & Plan: Mostly left sided, intermittent, hand and foot; occasionally right sided; will check labs today; may consider neurology consult if worse / no improvement/ changes noted  Orders: -     Thyroid Panel With TSH -     CBC with Differential/Platelet -     Comprehensive metabolic panel -     Vitamin B12 -     VITAMIN D 25 Hydroxy (Vit-D Deficiency, Fractures)        Return in about 6 months (around 01/29/2023) for CPE, labs.  This note was prepared with assistance of Software engineer. Occasional wrong-word or sound-a-like substitutions may have occurred due to the inherent limitations of voice recognition software.    Sarkis Rhines M Adahlia Stembridge, PA-C

## 2022-07-30 NOTE — Assessment & Plan Note (Signed)
Mostly left sided, intermittent, hand and foot; occasionally right sided; will check labs today; may consider neurology consult if worse / no improvement/ changes noted

## 2022-07-30 NOTE — Patient Instructions (Signed)
Welcome to [facility]! It was a pleasure meeting you today.  Labs today, treat pending results.   As discussed, Please schedule a 6 month follow up visit today.  PLEASE NOTE:  If you had any LAB tests please let us know if you have not heard back within a few days. You may see your results on MyChart before we have a chance to review them but we will give you a call once they are reviewed by [place]. If we ordered any REFERRALS today, please let us know if you have not heard from their office within the next two weeks. Let us know through MyChart if you are needing REFILLS, or have your pharmacy send [place] the request. You can also use MyChart to communicate with me or any office staff.  Please try these tips to maintain a healthy lifestyle:  Eat most of your calories during the day when you are active. Eliminate processed foods including packaged sweets (pies, cakes, cookies), reduce intake of potatoes, white bread, white pasta, and white rice. Look for whole grain options, oat flour or almond flour.  Each meal should contain half fruits/vegetables, one quarter protein, and one quarter carbs (no bigger than a computer mouse).  Cut down on sweet beverages. This includes juice, soda, and sweet tea. Also watch fruit intake, though this is a healthier sweet option, it still contains natural sugar! Limit to 3 servings daily.  Drink at least 1 glass of water with each meal and aim for at least 8 glasses (64 ounces) per day.  Exercise at least 150 minutes every week to the best of your ability.    Take Care,  Abdul Beirne, PA-C

## 2022-07-30 NOTE — Assessment & Plan Note (Signed)
Plan to recheck thyroid function, treat any abnormal results

## 2022-07-31 LAB — COMPREHENSIVE METABOLIC PANEL
ALT: 16 U/L (ref 0–35)
AST: 18 U/L (ref 0–37)
Albumin: 4.2 g/dL (ref 3.5–5.2)
Alkaline Phosphatase: 37 U/L — ABNORMAL LOW (ref 39–117)
BUN: 11 mg/dL (ref 6–23)
CO2: 23 mEq/L (ref 19–32)
Calcium: 9.2 mg/dL (ref 8.4–10.5)
Chloride: 103 mEq/L (ref 96–112)
Creatinine, Ser: 0.56 mg/dL (ref 0.40–1.20)
GFR: 115.76 mL/min (ref >60.00)
Glucose, Bld: 74 mg/dL (ref 70–99)
Potassium: 3.9 mEq/L (ref 3.5–5.1)
Sodium: 137 mEq/L (ref 135–145)
Total Bilirubin: 0.3 mg/dL (ref 0.2–1.2)
Total Protein: 6.8 g/dL (ref 6.0–8.3)

## 2022-07-31 LAB — CBC WITH DIFFERENTIAL/PLATELET
Basophils Absolute: 0.1 10*3/uL (ref 0.0–0.1)
Basophils Relative: 0.8 % (ref 0.0–3.0)
Eosinophils Absolute: 0 10*3/uL (ref 0.0–0.7)
Eosinophils Relative: 0.3 % (ref 0.0–5.0)
HCT: 42.8 % (ref 36.0–46.0)
Hemoglobin: 14.2 g/dL (ref 12.0–15.0)
Lymphocytes Relative: 26.3 % (ref 12.0–46.0)
Lymphs Abs: 2.2 10*3/uL (ref 0.7–4.0)
MCHC: 33.3 g/dL (ref 30.0–36.0)
MCV: 89.8 fl (ref 78.0–100.0)
Monocytes Absolute: 0.3 10*3/uL (ref 0.1–1.0)
Monocytes Relative: 3.9 % (ref 3.0–12.0)
Neutro Abs: 5.8 10*3/uL (ref 1.4–7.7)
Neutrophils Relative %: 68.7 % (ref 43.0–77.0)
Platelets: 204 10*3/uL (ref 150.0–400.0)
RBC: 4.77 Mil/uL (ref 3.87–5.11)
RDW: 14.2 % (ref 11.5–15.5)
WBC: 8.5 10*3/uL (ref 4.0–10.5)

## 2022-07-31 LAB — THYROID PANEL WITH TSH
Free Thyroxine Index: 2.2 (ref 1.4–3.8)
T3 Uptake: 22 % (ref 22–35)
T4, Total: 10.1 ug/dL (ref 5.1–11.9)
TSH: 1.74 mIU/L

## 2022-07-31 LAB — VITAMIN D 25 HYDROXY (VIT D DEFICIENCY, FRACTURES): VITD: 22.53 ng/mL — ABNORMAL LOW (ref 30.00–100.00)

## 2022-07-31 LAB — VITAMIN B12: Vitamin B-12: 232 pg/mL (ref 211–911)

## 2022-08-04 ENCOUNTER — Other Ambulatory Visit: Payer: Self-pay

## 2022-08-04 ENCOUNTER — Telehealth: Payer: Self-pay | Admitting: Physician Assistant

## 2022-08-04 MED ORDER — VITAMIN D (ERGOCALCIFEROL) 1.25 MG (50000 UNIT) PO CAPS: 50000.0000 [IU] | ORAL_CAPSULE | ORAL | 0 refills | Status: DC

## 2022-08-04 NOTE — Telephone Encounter (Signed)
Patient returned call. Requests to be called at ph# [phone]

## 2022-08-04 NOTE — Telephone Encounter (Signed)
Returned pt call; please see lab notes

## 2022-08-06 DIAGNOSIS — Z419 Encounter for procedure for purposes other than remedying health state, unspecified: Secondary | ICD-10-CM | POA: Diagnosis not present

## 2022-09-05 DIAGNOSIS — Z419 Encounter for procedure for purposes other than remedying health state, unspecified: Secondary | ICD-10-CM | POA: Diagnosis not present

## 2022-09-11 ENCOUNTER — Encounter: Payer: Self-pay | Admitting: Physician Assistant

## 2022-09-11 ENCOUNTER — Ambulatory Visit (INDEPENDENT_AMBULATORY_CARE_PROVIDER_SITE_OTHER): Payer: Medicaid Other | Admitting: Physician Assistant

## 2022-09-11 VITALS — BP 101/73 | HR 82 | Temp 97.8°F | Ht 64.0 in | Wt 133.8 lb

## 2022-09-11 DIAGNOSIS — N393 Stress incontinence (female) (male): Secondary | ICD-10-CM | POA: Diagnosis not present

## 2022-09-11 NOTE — Progress Notes (Signed)
   Subjective:    Patient ID: Leslie Shaw, female    DOB: 06/02/1986, 36 y.o.   MRN: [id number]  Chief Complaint  Patient presents with   bladder control    Pt wants to talk about bladder control    Follow-up    HPI Patient is in today for discussion about lack of bladder control. Issues going on since having her son almost 5 years ago. Stress incontinence with laughing / sneezing / jumping. Says she has to wear a pad almost daily and it has been irritating. She previously did pelvic floor PT and this did not help. She met with Dr. Schroeder and talked about options; very adamant that she doesn't want to go through surgery.   History reviewed. No pertinent past medical history.  Past Surgical History:  Procedure Laterality Date   APPENDECTOMY     CESAREAN SECTION      History reviewed. No pertinent family history.  Social History   Tobacco Use   Smoking status: Never   Smokeless tobacco: Never  Vaping Use   Vaping Use: Never used  Substance Use Topics   Alcohol use: No   Drug use: No     Allergies  Allergen Reactions   Other Swelling    Tree nuts- pine cone   Pistachio Nut (Diagnostic) Swelling   Shrimp [Shellfish Allergy] Swelling    Review of Systems NEGATIVE UNLESS OTHERWISE INDICATED IN HPI      Objective:     BP 101/73 (BP Location: Left Arm, Patient Position: Sitting)   Pulse 82   Temp 97.8 F (36.6 C) (Temporal)   Ht 5' 4" (1.626 m)   Wt 133 lb 12.8 oz (60.7 kg)   LMP 09/04/2022 (Exact Date)   SpO2 94%   BMI 22.97 kg/m   Wt Readings from Last 3 Encounters:  09/11/22 133 lb 12.8 oz (60.7 kg)  07/30/22 132 lb (59.9 kg)  07/11/22 128 lb 6.4 oz (58.2 kg)    BP Readings from Last 3 Encounters:  09/11/22 101/73  07/30/22 108/74  07/11/22 94/66     Physical Exam Vitals reviewed.  Constitutional:      Appearance: Normal appearance.  Cardiovascular:     Rate and Rhythm: Normal rate and regular rhythm.     Pulses: Normal pulses.   Pulmonary:     Effort: Pulmonary effort is normal.     Breath sounds: Normal breath sounds.  Neurological:     Mental Status: She is alert.  Psychiatric:        Mood and Affect: Mood normal.        Assessment & Plan:  Stress incontinence   Informed patient I would reach out to Dr. Schroeder for her about next steps and if something like a pessary would be beneficial for her or not in place of going through with surgery. Pt agreeable and will wait for next steps.    Alyssa M Allwardt, PA-C 

## 2022-09-16 NOTE — Progress Notes (Unsigned)
[facility]   Subjective:     Chief Complaint: No chief complaint on file.  History of Present Illness: Leslie Shaw is a 36 y.o. female with {PFD symptoms:24771} who presents today for a pessary fitting.    Past Medical History: Patient  has no past medical history on file.   Past Surgical History: She  has a past surgical history that includes Cesarean section and Appendectomy.   Medications: She has a current medication list which includes the following prescription(s): norethindrone acetate-ethinyl estrad-fe, vitamin d (ergocalciferol), and [DISCONTINUED] norgestrel-ethinyl estradiol.   Allergies: Patient is allergic to other, pistachio nut (diagnostic), and shrimp [shellfish allergy].   Social History: Patient  reports that she has never smoked. She has never used smokeless tobacco. She reports that she does not drink alcohol and does not use drugs.      Objective:    LMP 09/04/2022 (Exact Date)  Gen: No apparent distress, A&O x 3. Pelvic Exam: Normal external female genitalia; Bartholin's and Skene's glands normal in appearance; urethral meatus {urethra:24773}, no urethral masses or discharge.   A size *** {pessary type:24772} pessary was fitted. It was comfortable, stayed in place with valsalva and was an appropriate size on examination, with one finger fitting between the pessary and the vaginal walls. We tied a string to it and the patient demonstrated proper removal and replacement.      No data to display           Assessment/Plan:    Assessment: Leslie Shaw is a 36 y.o. with {PFD symptoms:24771} who presents for a pessary fitting. Plan: She was fitted with a *** {pessary type:24772} pessary. She will {pessary plan:24776}. She will {use:24778} {lubricant:24777}.   Follow-up in *** {days/wks/mos/yrs:310907} for a pessary check or sooner as needed.  All questions were answered.    Selmer Dominion, NP

## 2022-09-18 ENCOUNTER — Encounter: Payer: Self-pay | Admitting: Obstetrics and Gynecology

## 2022-09-18 ENCOUNTER — Ambulatory Visit (INDEPENDENT_AMBULATORY_CARE_PROVIDER_SITE_OTHER): Payer: Medicaid Other | Admitting: Obstetrics and Gynecology

## 2022-09-18 VITALS — BP 118/83 | HR 91

## 2022-09-18 DIAGNOSIS — N393 Stress incontinence (female) (male): Secondary | ICD-10-CM

## 2022-09-18 NOTE — Patient Instructions (Signed)
Use lubrication every insertion of pessary.   Take out once a week to clean with soap and water. You can leave it out for a night if you need to.

## 2022-10-06 DIAGNOSIS — Z419 Encounter for procedure for purposes other than remedying health state, unspecified: Secondary | ICD-10-CM | POA: Diagnosis not present

## 2022-10-13 ENCOUNTER — Encounter: Payer: Self-pay | Admitting: Obstetrics and Gynecology

## 2022-10-13 ENCOUNTER — Ambulatory Visit (INDEPENDENT_AMBULATORY_CARE_PROVIDER_SITE_OTHER): Payer: Medicaid Other | Admitting: Obstetrics and Gynecology

## 2022-10-13 VITALS — BP 107/79 | HR 82

## 2022-10-13 DIAGNOSIS — N393 Stress incontinence (female) (male): Secondary | ICD-10-CM | POA: Diagnosis not present

## 2022-10-13 DIAGNOSIS — N3281 Overactive bladder: Secondary | ICD-10-CM

## 2022-10-13 MED ORDER — SULFAMETHOXAZOLE-TRIMETHOPRIM 800-160 MG PO TABS: ORAL_TABLET | ORAL | 0 refills | Status: DC

## 2022-10-13 MED ORDER — DIAZEPAM 5 MG PO TABS: ORAL_TABLET | ORAL | 0 refills | Status: DC

## 2022-10-13 NOTE — Progress Notes (Signed)
[facility]   Subjective:     Chief Complaint:  Chief Complaint  Patient presents with   Pessary Check   History of Present Illness: Leslie Shaw is a 37 y.o. female with stress incontinence who presents for a pessary check. She is using a size #3 incontinence ring pessary.  She is not using vaginal estrogen. She denies vaginal bleeding.  She has been unable to remove the pessary and reports it is uncomfortable for her to manage, especially during her period. She reports is has significantly helped with her leakage but it is not conducive to her lifestyle.   Past Medical History: Patient  has no past medical history on file.   Past Surgical History: She  has a past surgical history that includes Cesarean section and Appendectomy.   Medications: She has a current medication list which includes the following prescription(s): norethindrone acetate-ethinyl estrad-fe, vitamin d (ergocalciferol), and [DISCONTINUED] norgestrel-ethinyl estradiol.   Allergies: Patient is allergic to other, pistachio nut (diagnostic), and shrimp [shellfish allergy].   Social History: Patient  reports that she has never smoked. She has never used smokeless tobacco. She reports that she does not drink alcohol and does not use drugs.      Objective:    Physical Exam: BP 107/79   Pulse 82   LMP 10/05/2022  Gen: No apparent distress, A&O x 3. Detailed Urogynecologic Evaluation:  Pelvic Exam: Normal external female genitalia; Bartholin's and Skene's glands normal in appearance; urethral meatus normal in appearance, no urethral masses or discharge. The pessary was noted to be in place. It was removed.      Assessment/Plan:    Assessment: Leslie Shaw is a 37 y.o. with stress incontinence here for a pessary check. She is doing well.  Plan: She would like to have urethral bulking as the pessary was too difficult for her to manage but did help her stress incontinence.   Simple CMG  performed in office today during visit that did show Stress incontinence.   Will plan for Urethral bulking in office with patient. Information given and explained that this is an in office procedure with no sedation and she can return to her normal daily activities.   We discussed that she can call or send a my chart message if she has other questions.   Prescription sent in for 1 dose valium prior to procedure and Bactrim which she will take one dose prior to procedure and one dose following urethral bulking.    All questions were answered.

## 2022-10-13 NOTE — Progress Notes (Signed)
Verbal consent was obtained to perform simple CMG procedure:   Prolapse was reduced using 2 large cotton swabs. Urethra was prepped with betadine and a 64F catheter was placed and bladder was drained completely. The bladder was then backfilled with sterile water by gravity.  First sensation: 110 First Desire: 130 Strong Desire: 260 Capacity: 320 Cough stress test was positive. Valsalva stress test was negative but she did not push well.  She was was allowed to void on her own.   Interpretation: CMG showed within normal limits sensation, and decreased cystometric capacity. Findings positive for stress incontinence, negative for detrusor overactivity.

## 2022-10-13 NOTE — Patient Instructions (Signed)
I have given you information on Urethral bulking today. This is done in the office and you can return to your regular daily activities after the procedure. Let me know if you have any questions about the procedure.

## 2022-10-27 NOTE — Progress Notes (Signed)
PA request was faxed to [facility] for to check procedure : Urethral Bulking CPT code: 14431 and [phone]  PA needed for this procedure. PA was APPROVED and scanned to chart

## 2022-11-06 ENCOUNTER — Ambulatory Visit (INDEPENDENT_AMBULATORY_CARE_PROVIDER_SITE_OTHER): Payer: Medicaid Other | Admitting: Obstetrics and Gynecology

## 2022-11-06 VITALS — BP 106/74 | HR 88

## 2022-11-06 DIAGNOSIS — N393 Stress incontinence (female) (male): Secondary | ICD-10-CM

## 2022-11-06 DIAGNOSIS — R350 Frequency of micturition: Secondary | ICD-10-CM

## 2022-11-06 DIAGNOSIS — Z419 Encounter for procedure for purposes other than remedying health state, unspecified: Secondary | ICD-10-CM | POA: Diagnosis not present

## 2022-11-06 LAB — POCT URINALYSIS DIPSTICK
Bilirubin, UA: NEGATIVE
Blood, UA: NEGATIVE
Glucose, UA: NEGATIVE
Ketones, UA: NEGATIVE
Leukocytes, UA: NEGATIVE
Nitrite, UA: NEGATIVE
Protein, UA: NEGATIVE
Spec Grav, UA: >=1.03 — AB (ref 1.010–1.025)
Urobilinogen, UA: 0.2 E.U./dL
pH, UA: 6.5 (ref 5.0–8.0)

## 2022-11-06 NOTE — Progress Notes (Signed)
Bulkamid Injection  CC: 37 y.o. y.o. F with stress incontinence who presents for transurethral Bulkamid injection.  Patient signed her consent form.  She started antibiotic prophylaxis today.  Procedure: Time out was performed. The bladder was catheterized and 10 ml of 2% lidocaine jelly placed in the urethra. A urethral block was performed by injecting a total of 55ml of 1% lidocaine with epinephrine at 3 and 6 o'clock adjacent to the urethra.  The needle was primed.  The cystoscope was inserted to the level of the bladder neck.  The needle was inserted 2 cm and the scope was pulled back into the urethra 2 cm.  The needle was inserted bevel up at the 5 o'clock position and the Bulkamid was injected to obtain coaptation.  This was repeated at the 2 o'clock,  10 o'clock and 7 o'clock positions.   A total of 1.84ml syringes were used and good circumferential coaptation was noted.  The patient tolerated the procedure well. She was asked to void after the procedure.  Post-Void Residual (PVR) by Bladder Scan: In order to evaluate bladder emptying, we discussed obtaining a postvoid residual and she agreed to this procedure.  Procedure: The ultrasound unit was placed on the patient's abdomen in the suprapubic region after the patient had voided. A PVR of 7 ml was obtained by bladder scan.  ASSESSMENT: 37 y.o. y.o. s/p transurethral Bulkamid injection for stress incontinence.   PLAN: Patient will follow up in 4 weeks to reassess. Voiding and post-procedure precautions were given. She will return for heavy bleeding, fevers, dysuria lasting beyond today and incomplete emptying.  All questions were answered.  Jaquita Folds, MD

## 2022-11-06 NOTE — Patient Instructions (Signed)
Taking Care of Yourself after Urodynamics, Cystoscopy, Bulkamid Injection, or Botox Injection   Drink plenty of water for a day or two following your procedure. Try to have about 8 ounces (one cup) at a time, and do this 6 times or more per day unless you have fluid restrictitons AVOID irritative beverages such as coffee, tea, soda, alcoholic or citrus drinks for a day or two, as this may cause burning with urination.  For the first 1-2 days after the procedure, your urine may be pink or red in color. You may have some blood in your urine as a normal side effect of the procedure. Large amounts of bleeding or difficulty urinating are NOT normal. Call the nurse line if this happens or go to the nearest Emergency Room if the bleeding is heavy or you cannot urinate at all and it is after hours. If you had a Bulkamid injection in the urethra and need to be catheterized, ask for a pediatric catheter to be used (size 10 or 12-French) so the material is not pushed out of place.   You may experience some discomfort or a burning sensation with urination after having this procedure. You can use over the counter Azo or pyridium to help with burning and follow the instructions on the packaging. If it does not improve within 1-2 days, or other symptoms appear (fever, chills, or difficulty urinating) call the office to speak to a nurse.  You may return to normal daily activities such as work, school, driving, exercising and housework on the day of the procedure. If your doctor gave you a prescription, take it as ordered.     

## 2022-11-24 ENCOUNTER — Encounter: Payer: Self-pay | Admitting: *Deleted

## 2022-12-05 ENCOUNTER — Ambulatory Visit: Payer: Medicaid Other | Admitting: Obstetrics and Gynecology

## 2022-12-05 DIAGNOSIS — Z419 Encounter for procedure for purposes other than remedying health state, unspecified: Secondary | ICD-10-CM | POA: Diagnosis not present

## 2022-12-09 ENCOUNTER — Ambulatory Visit (INDEPENDENT_AMBULATORY_CARE_PROVIDER_SITE_OTHER): Payer: Medicaid Other | Admitting: Obstetrics and Gynecology

## 2022-12-09 ENCOUNTER — Encounter: Payer: Self-pay | Admitting: Obstetrics and Gynecology

## 2022-12-09 VITALS — BP 106/75 | HR 87

## 2022-12-09 DIAGNOSIS — N393 Stress incontinence (female) (male): Secondary | ICD-10-CM

## 2022-12-09 NOTE — Progress Notes (Deleted)
[facility] Return Visit  SUBJECTIVE  History of Present Illness: Leslie Shaw is a 37 y.o. female seen in follow-up for urethral bulking.     Past Medical History: Patient  has no past medical history on file.   Past Surgical History: She  has a past surgical history that includes Cesarean section and Appendectomy.   Medications: She has a current medication list which includes the following prescription(s): diazepam, norethindrone acetate-ethinyl estrad-fe, sulfamethoxazole-trimethoprim, vitamin d (ergocalciferol), and [DISCONTINUED] norgestrel-ethinyl estradiol.   Allergies: Patient is allergic to other, pistachio nut (diagnostic), and shrimp [shellfish allergy].   Social History: Patient  reports that she has never smoked. She has never used smokeless tobacco. She reports that she does not drink alcohol and does not use drugs.      OBJECTIVE     Physical Exam: There were no vitals filed for this visit. Gen: No apparent distress, A&O x 3.  Detailed Urogynecologic Evaluation:  Deferred. Prior exam showed:      No data to display             ASSESSMENT AND PLAN    Leslie Shaw is a 37 y.o. with:  No diagnosis found.

## 2022-12-09 NOTE — Progress Notes (Signed)
[facility] Return Visit  SUBJECTIVE  History of Present Illness: Leslie Shaw is a 37 y.o. female seen in follow-up for mixed incontinence, stress predominant.   She feels that she has about a 50-60% improvement. She is happy with this for now and does not feel she wants further treatment at this time. Feels that she empties her bladder well without straining. Denies burning with urination. Has rare leakage with urgency. Voids every 2-3 hours.    Past Medical History: Patient  has no past medical history on file.   Past Surgical History: She  has a past surgical history that includes Cesarean section and Appendectomy.   Medications: She has a current medication list which includes the following prescription(s): diazepam, norethindrone acetate-ethinyl estrad-fe, sulfamethoxazole-trimethoprim, vitamin d (ergocalciferol), and [DISCONTINUED] norgestrel-ethinyl estradiol.   Allergies: Patient is allergic to other, pistachio nut (diagnostic), and shrimp [shellfish allergy].   Social History: Patient  reports that she has never smoked. She has never used smokeless tobacco. She reports that she does not drink alcohol and does not use drugs.      OBJECTIVE     Physical Exam: Vitals:   12/09/22 0944  BP: 106/75  Pulse: 87   Gen: No apparent distress, A&O x 3.  Detailed Urogynecologic Evaluation:  Deferred.    ASSESSMENT AND PLAN    Leslie Shaw is a 37 y.o. with:  1. SUI (stress urinary incontinence, female)    - We discussed repeat Bulkamid injection to try to get closer to 70-80% improvement. She has Ramadan coming up soon so does not want to do any procedures at this time. She may be interested in the future.  - She is not interested in a sling.  - Follow up as needed  Jaquita Folds, MD  Time spent: I spent 15 minutes dedicated to the care of this patient on the date of this encounter to include pre-visit review of records, face-to-face time with the  patient  and post visit documentation.

## 2022-12-30 ENCOUNTER — Encounter: Payer: Self-pay | Admitting: Physician Assistant

## 2022-12-31 NOTE — Telephone Encounter (Signed)
Please see pt msg and advise 

## 2022-12-31 NOTE — Telephone Encounter (Signed)
Please see pt msg and advise, called Sharon at [phone] and left a detailed vm we were calling in reference to a pt in regards to a possible referral and needing to get him in their office to be seen.

## 2023-01-05 DIAGNOSIS — Z419 Encounter for procedure for purposes other than remedying health state, unspecified: Secondary | ICD-10-CM | POA: Diagnosis not present

## 2023-01-28 ENCOUNTER — Ambulatory Visit (INDEPENDENT_AMBULATORY_CARE_PROVIDER_SITE_OTHER): Payer: Medicaid Other | Admitting: Physician Assistant

## 2023-01-28 VITALS — BP 98/62 | HR 95 | Temp 97.5°F | Ht 64.0 in | Wt 132.4 lb

## 2023-01-28 DIAGNOSIS — R1013 Epigastric pain: Secondary | ICD-10-CM

## 2023-01-28 LAB — CBC WITH DIFFERENTIAL/PLATELET
Basophils Absolute: 0 10*3/uL (ref 0.0–0.1)
Basophils Relative: 0.4 % (ref 0.0–3.0)
Eosinophils Absolute: 0 10*3/uL (ref 0.0–0.7)
Eosinophils Relative: 0.3 % (ref 0.0–5.0)
HCT: 42.5 % (ref 36.0–46.0)
Hemoglobin: 14.7 g/dL (ref 12.0–15.0)
Lymphocytes Relative: 34.5 % (ref 12.0–46.0)
Lymphs Abs: 2.6 10*3/uL (ref 0.7–4.0)
MCHC: 34.7 g/dL (ref 30.0–36.0)
MCV: 87.8 fl (ref 78.0–100.0)
Monocytes Absolute: 0.3 10*3/uL (ref 0.1–1.0)
Monocytes Relative: 4.5 % (ref 3.0–12.0)
Neutro Abs: 4.5 10*3/uL (ref 1.4–7.7)
Neutrophils Relative %: 60.3 % (ref 43.0–77.0)
Platelets: 216 10*3/uL (ref 150.0–400.0)
RBC: 4.84 Mil/uL (ref 3.87–5.11)
RDW: 13.5 % (ref 11.5–15.5)
WBC: 7.5 10*3/uL (ref 4.0–10.5)

## 2023-01-28 LAB — COMPREHENSIVE METABOLIC PANEL
ALT: 15 U/L (ref 0–35)
AST: 18 U/L (ref 0–37)
Albumin: 4.3 g/dL (ref 3.5–5.2)
Alkaline Phosphatase: 41 U/L (ref 39–117)
BUN: 11 mg/dL (ref 6–23)
CO2: 22 mEq/L (ref 19–32)
Calcium: 9.3 mg/dL (ref 8.4–10.5)
Chloride: 107 mEq/L (ref 96–112)
Creatinine, Ser: 0.6 mg/dL (ref 0.40–1.20)
GFR: 113.46 mL/min (ref >60.00)
Glucose, Bld: 108 mg/dL — ABNORMAL HIGH (ref 70–99)
Potassium: 3.4 mEq/L — ABNORMAL LOW (ref 3.5–5.1)
Sodium: 140 mEq/L (ref 135–145)
Total Bilirubin: 0.2 mg/dL (ref 0.2–1.2)
Total Protein: 6.8 g/dL (ref 6.0–8.3)

## 2023-01-28 LAB — LIPASE: Lipase: 24 U/L (ref 11.0–59.0)

## 2023-01-28 MED ORDER — OMEPRAZOLE 40 MG PO CPDR: 40.0000 mg | DELAYED_RELEASE_CAPSULE | Freq: Every day | ORAL | 1 refills | Status: DC

## 2023-01-28 NOTE — Progress Notes (Signed)
Subjective:    Patient ID: Leslie Shaw, female    DOB: 1986-05-28, 37 y.o.   MRN: [id number]  Chief Complaint  Patient presents with   Abdominal Pain    Pt in office and c/o abdominal pain for the past month that comes and goes; most pain is in upper abdomen and in the mornings before eating and sometimes after eating; no indigestion per patient, and feels tight;     Abdominal Pain   Patient is in today for epigastric abdominal pain x 4 weeks. Started during Ramadan.  -Feels better when sleeping -Comes and goes -Worst pain in the morning - feels balled-up /tight, just wants to sleep & can't eat during these intense pains; usually lasts a few hours -Honey with water seems to help; otherwise no medications  -Never has had this kind of pain before & has fasted her entire life. No fevers or chills. No bowel movement changes. No nausea or vomiting. No other symptoms.    No past medical history on file.  Past Surgical History:  Procedure Laterality Date   APPENDECTOMY     CESAREAN SECTION      No family history on file.  Social History   Tobacco Use   Smoking status: Never   Smokeless tobacco: Never  Vaping Use   Vaping Use: Never used  Substance Use Topics   Alcohol use: No   Drug use: No     Allergies  Allergen Reactions   Other Swelling    Tree nuts- pine cone   Pistachio Nut (Diagnostic) Swelling   Shrimp [Shellfish Allergy] Swelling    Review of Systems  Gastrointestinal:  Positive for abdominal pain.   NEGATIVE UNLESS OTHERWISE INDICATED IN HPI      Objective:     BP 98/62 (BP Location: Left Arm)   Pulse 95   Temp (!) 97.5 F (36.4 C) (Temporal)   Ht  (1.626 m)   Wt 132 lb 6.4 oz (60.1 kg)   SpO2 98%   BMI 22.73 kg/m   Wt Readings from Last 3 Encounters:  01/28/23 132 lb 6.4 oz (60.1 kg)  09/11/22 133 lb 12.8 oz (60.7 kg)  07/30/22 132 lb (59.9 kg)    BP Readings from Last 3 Encounters:  01/28/23 98/62  12/09/22 106/75  11/06/22  106/74     Physical Exam Vitals and nursing note reviewed.  Constitutional:      Appearance: She is well-developed.  Eyes:     Extraocular Movements: Extraocular movements intact.     Pupils: Pupils are equal, round, and reactive to light.  Cardiovascular:     Rate and Rhythm: Normal rate and regular rhythm.     Heart sounds: Normal heart sounds.  Pulmonary:     Effort: Pulmonary effort is normal.     Breath sounds: Normal breath sounds.  Abdominal:     General: Bowel sounds are normal.     Palpations: Abdomen is soft.     Tenderness: There is abdominal tenderness in the epigastric area. There is no right CVA tenderness, left CVA tenderness, guarding or rebound.     Hernia: No hernia is present.  Skin:    General: Skin is warm.     Findings: No rash.  Neurological:     General: No focal deficit present.     Mental Status: She is alert.  Psychiatric:        Mood and Affect: Mood normal.        Assessment &  Plan:  Epigastric pain -     CBC with Differential/Platelet -     Comprehensive metabolic panel -     Lipase  Other orders -     Omeprazole; Take 1 capsule (40 mg total) by mouth daily for 15 days. Take on an empty stomach 30-60 minutes prior to food.  Dispense: 15 capsule; Refill: 1   No red flags, not an acute abdomen. Suspect likely gastritis. Will check labs today. Start on Omeprazole as directed. ED if acutely worse symptoms. Consider imaging if worse / not improving.      Return if symptoms worsen or fail to improve.    Aaryanna Hyden M Mosetta Ferdinand, PA-C

## 2023-02-04 DIAGNOSIS — Z419 Encounter for procedure for purposes other than remedying health state, unspecified: Secondary | ICD-10-CM | POA: Diagnosis not present

## 2023-02-04 DIAGNOSIS — M79672 Pain in left foot: Secondary | ICD-10-CM | POA: Diagnosis not present

## 2023-02-06 ENCOUNTER — Other Ambulatory Visit: Payer: Self-pay | Admitting: Physician Assistant

## 2023-03-07 DIAGNOSIS — Z419 Encounter for procedure for purposes other than remedying health state, unspecified: Secondary | ICD-10-CM | POA: Diagnosis not present

## 2023-03-27 DIAGNOSIS — X58XXXA Exposure to other specified factors, initial encounter: Secondary | ICD-10-CM | POA: Diagnosis not present

## 2023-03-27 DIAGNOSIS — S60351A Superficial foreign body of right thumb, initial encounter: Secondary | ICD-10-CM | POA: Diagnosis not present

## 2023-04-06 DIAGNOSIS — Z419 Encounter for procedure for purposes other than remedying health state, unspecified: Secondary | ICD-10-CM | POA: Diagnosis not present

## 2023-04-24 ENCOUNTER — Other Ambulatory Visit: Payer: Self-pay | Admitting: Physician Assistant

## 2023-05-07 DIAGNOSIS — Z419 Encounter for procedure for purposes other than remedying health state, unspecified: Secondary | ICD-10-CM | POA: Diagnosis not present

## 2023-05-11 ENCOUNTER — Other Ambulatory Visit: Payer: Self-pay | Admitting: Physician Assistant

## 2023-05-11 ENCOUNTER — Encounter: Payer: Self-pay | Admitting: Physician Assistant

## 2023-05-11 MED ORDER — NORGESTIMATE-ETH ESTRADIOL 0.25-35 MG-MCG PO TABS: 1.0000 | ORAL_TABLET | Freq: Every day | ORAL | 11 refills | Status: DC

## 2023-06-03 ENCOUNTER — Ambulatory Visit: Payer: Medicaid Other | Admitting: Family

## 2023-06-03 ENCOUNTER — Encounter: Payer: Self-pay | Admitting: Family

## 2023-06-03 VITALS — BP 100/67 | Temp 97.7°F | Ht 64.0 in | Wt 138.1 lb

## 2023-06-03 DIAGNOSIS — R21 Rash and other nonspecific skin eruption: Secondary | ICD-10-CM

## 2023-06-03 MED ORDER — TRIAMCINOLONE ACETONIDE 0.1 % EX CREA: 1.0000 | TOPICAL_CREAM | Freq: Two times a day (BID) | CUTANEOUS | 0 refills | Status: DC

## 2023-06-03 MED ORDER — HYDROXYZINE HCL 10 MG PO TABS
10.0000 mg | ORAL_TABLET | Freq: Three times a day (TID) | ORAL | 0 refills | Status: DC | PRN
Start: 2023-06-03 — End: 2023-07-29

## 2023-06-03 NOTE — Progress Notes (Addendum)
Patient ID: Leslie Shaw, female    DOB: 06-07-1986, 37 y.o.   MRN: [id number]  Chief Complaint  Patient presents with   Rash    Pt c/o bilateral leg itchiness from knees down to ankles for 3 days. Has tried benadryl which did help.     HPI:      Leg itching/rash:  - Patient reports having problems with legs itching for the past two days. - Denies being outside or doing anything unusual that could have caused the itching. - Has used Benadryl cream which helps some, but has to keep reapplying - Itching seems like a reaction, possibly from contact with something. - No change in detergents or use of new lotions or creams. - Has not eaten anything unusual and has not been bitten by bugs recently.   Assessment & Plan:  1. Skin rash bilateral lower legs, small red bumps, mild erythema from scratching, possibly due to an allergic reaction or contact with an irritant. Checking allergy panel, sending steroid cream & hydroxyzine, advised on both meds use & SE.  - Allergen Panel (27) + [facility] - Allergy Panel 11, [employer] - hydrOXYzine (ATARAX) 10 MG tablet; Take 1-2 tablets (10-20 mg total) by mouth 3 (three) times daily as needed for itching.  Dispense: 30 tablet; Refill: 0 - triamcinolone cream (KENALOG) 0.1 %; Apply 1 Application topically 2 (two) times daily.  Dispense: 30 g; Refill: 0  Subjective:    Outpatient Medications Prior to Visit  Medication Sig Dispense Refill   Norethindrone Acetate-Ethinyl Estrad-FE (LOESTRIN 24 FE) 1-20 MG-MCG(24) tablet Take 1 tablet by mouth daily. 28 tablet 11   norgestimate-ethinyl estradiol (ORTHO-CYCLEN) 0.25-35 MG-MCG tablet Take 1 tablet by mouth daily. 28 tablet 11   diazepam (VALIUM) 5 MG tablet Place 1 tablet vaginally nightly as needed for muscle spasm/ pelvic pain. (Patient not taking: Reported on 06/03/2023) 1 tablet 0   omeprazole (PRILOSEC) 40 MG capsule Take 1 capsule (40 mg total) by mouth daily for 15 days. Take on an empty stomach 30-60  minutes prior to food. 15 capsule 1   No facility-administered medications prior to visit.   No past medical history on file. Past Surgical History:  Procedure Laterality Date   APPENDECTOMY     CESAREAN SECTION     Allergies  Allergen Reactions   Other Swelling    Tree nuts- pine cone   Pistachio Nut (Diagnostic) Swelling   Shrimp [Shellfish Allergy] Swelling      Objective:    Physical Exam Vitals and nursing note reviewed.  Constitutional:      Appearance: Normal appearance.  Cardiovascular:     Rate and Rhythm: Normal rate and regular rhythm.  Pulmonary:     Effort: Pulmonary effort is normal.     Breath sounds: Normal breath sounds.  Musculoskeletal:        General: Normal range of motion.  Skin:    General: Skin is warm and dry.     Findings: Rash (bilateral lower legs, red pinpoint bumps, mild erythema) present.  Neurological:     Mental Status: She is alert.  Psychiatric:        Mood and Affect: Mood normal.        Behavior: Behavior normal.    BP 100/67 (BP Location: Left Arm, Patient Position: Sitting, Cuff Size: Normal)   Temp 97.7 F (36.5 C) (Temporal)   Ht 5\' 4"  (1.626 m)   Wt 138 lb 2 oz (62.7 kg)   LMP  (  LMP Unknown)   SpO2 98%   BMI 23.71 kg/m  Wt Readings from Last 3 Encounters:  06/03/23 138 lb 2 oz (62.7 kg)  01/28/23 132 lb 6.4 oz (60.1 kg)  09/11/22 133 lb 12.8 oz (60.7 kg)       Dulce Sellar, NP

## 2023-06-04 LAB — ALLERGY PANEL 11, MOLD GROUP
Allergen, A. alternata, m6: <0.1 kU/L
Allergen, Mucor Racemosus, M4: <0.1 kU/L
Aspergillus fumigatus, m3: <0.1 kU/L
CLADOSPORIUM HERBARUM (M2) IGE: <0.1 kU/L
CLASS: 0
CLASS: 0
Candida Albicans: <0.1 kU/L
Class: 0
Class: 0
Class: 0

## 2023-06-04 LAB — INTERPRETATION:

## 2023-06-07 DIAGNOSIS — Z419 Encounter for procedure for purposes other than remedying health state, unspecified: Secondary | ICD-10-CM | POA: Diagnosis not present

## 2023-06-07 LAB — ALLERGEN PANEL (27) + IGE
Alternaria Alternata IgE: <0.1 kU/L
Aspergillus Fumigatus IgE: <0.1 kU/L
Bahia Grass IgE: <0.1 kU/L
Bermuda Grass IgE: <0.1 kU/L
Cat Dander IgE: <0.1 kU/L
Cedar, Mountain IgE: <0.1 kU/L
Cladosporium Herbarum IgE: <0.1 kU/L
Cocklebur IgE: <0.1 kU/L
Cockroach, American IgE: <0.1 kU/L
Common Silver Birch IgE: <0.1 kU/L
D Farinae IgE: <0.1 kU/L
D Pteronyssinus IgE: <0.1 kU/L
Dog Dander IgE: <0.1 kU/L
Elm, American IgE: <0.1 kU/L
Hickory, White IgE: <0.1 kU/L
IgE (Immunoglobulin E), Serum: 103 [IU]/mL (ref 6–495)
Johnson Grass IgE: <0.1 kU/L
Kentucky Bluegrass IgE: <0.1 kU/L
Maple/Box Elder IgE: <0.1 kU/L
Mucor Racemosus IgE: <0.1 kU/L
Oak, White IgE: <0.1 kU/L
Penicillium Chrysogen IgE: <0.1 kU/L
Pigweed, Rough IgE: <0.1 kU/L
Plantain, English IgE: <0.1 kU/L
Ragweed, Short IgE: <0.1 kU/L
Setomelanomma Rostrat: <0.1 kU/L
Timothy Grass IgE: <0.1 kU/L
White Mulberry IgE: <0.1 kU/L

## 2023-06-17 DIAGNOSIS — B349 Viral infection, unspecified: Secondary | ICD-10-CM | POA: Diagnosis not present

## 2023-06-17 DIAGNOSIS — Z20822 Contact with and (suspected) exposure to covid-19: Secondary | ICD-10-CM | POA: Diagnosis not present

## 2023-07-07 DIAGNOSIS — Z419 Encounter for procedure for purposes other than remedying health state, unspecified: Secondary | ICD-10-CM | POA: Diagnosis not present

## 2023-07-22 ENCOUNTER — Ambulatory Visit: Payer: Medicaid Other | Admitting: Physician Assistant

## 2023-07-22 ENCOUNTER — Encounter: Payer: Self-pay | Admitting: Physician Assistant

## 2023-07-22 VITALS — BP 100/68 | HR 84 | Temp 97.7°F | Ht 64.0 in | Wt 139.8 lb

## 2023-07-22 DIAGNOSIS — R21 Rash and other nonspecific skin eruption: Secondary | ICD-10-CM

## 2023-07-22 DIAGNOSIS — B001 Herpesviral vesicular dermatitis: Secondary | ICD-10-CM | POA: Diagnosis not present

## 2023-07-22 DIAGNOSIS — L299 Pruritus, unspecified: Secondary | ICD-10-CM

## 2023-07-22 LAB — COMPREHENSIVE METABOLIC PANEL
ALT: 13 U/L (ref 0–35)
AST: 15 U/L (ref 0–37)
Albumin: 4.3 g/dL (ref 3.5–5.2)
Alkaline Phosphatase: 42 U/L (ref 39–117)
BUN: 10 mg/dL (ref 6–23)
CO2: 24 meq/L (ref 19–32)
Calcium: 9.2 mg/dL (ref 8.4–10.5)
Chloride: 102 meq/L (ref 96–112)
Creatinine, Ser: 0.57 mg/dL (ref 0.40–1.20)
GFR: 114.48 mL/min (ref >60.00)
Glucose, Bld: 85 mg/dL (ref 70–99)
Potassium: 4 meq/L (ref 3.5–5.1)
Sodium: 135 meq/L (ref 135–145)
Total Bilirubin: 0.3 mg/dL (ref 0.2–1.2)
Total Protein: 7.2 g/dL (ref 6.0–8.3)

## 2023-07-22 LAB — CBC WITH DIFFERENTIAL/PLATELET
Basophils Absolute: 0 10*3/uL (ref 0.0–0.1)
Basophils Relative: 0.6 % (ref 0.0–3.0)
Eosinophils Absolute: 0 10*3/uL (ref 0.0–0.7)
Eosinophils Relative: 0.5 % (ref 0.0–5.0)
HCT: 46.9 % — ABNORMAL HIGH (ref 36.0–46.0)
Hemoglobin: 15.4 g/dL — ABNORMAL HIGH (ref 12.0–15.0)
Lymphocytes Relative: 38 % (ref 12.0–46.0)
Lymphs Abs: 2.7 10*3/uL (ref 0.7–4.0)
MCHC: 32.9 g/dL (ref 30.0–36.0)
MCV: 90.2 fL (ref 78.0–100.0)
Monocytes Absolute: 0.3 10*3/uL (ref 0.1–1.0)
Monocytes Relative: 4.5 % (ref 3.0–12.0)
Neutro Abs: 4.1 10*3/uL (ref 1.4–7.7)
Neutrophils Relative %: 56.4 % (ref 43.0–77.0)
Platelets: 224 10*3/uL (ref 150.0–400.0)
RBC: 5.2 Mil/uL — ABNORMAL HIGH (ref 3.87–5.11)
RDW: 14.6 % (ref 11.5–15.5)
WBC: 7.2 10*3/uL (ref 4.0–10.5)

## 2023-07-22 LAB — TSH: TSH: 2.14 u[IU]/mL (ref 0.35–5.50)

## 2023-07-22 MED ORDER — PERMETHRIN 5 % EX CREA
1.0000 | TOPICAL_CREAM | Freq: Once | CUTANEOUS | 1 refills | Status: AC
Start: 2023-07-22 — End: 2023-07-22

## 2023-07-22 NOTE — Progress Notes (Signed)
Subjective:    Patient ID: Leslie Shaw, female    DOB: Feb 12, 1986, 37 y.o.   MRN: [id number]  Chief Complaint  Patient presents with   Allergies    Pt in office due to constant Allergy issues and itching on BIL LE; itching for over a month, not sure if this is an allergy or something else. Medication and cream was working but almost out of medication and still irritated and red.     HPI Discussed the use of AI scribe software for clinical note transcription with the patient, who gave verbal consent to proceed.  History of Present Illness   The patient, with a history of intermittent cold sores, presents with a one-month history of persistent itching on her legs. The itching is described as severe, particularly at night, and is associated with small red bumps. The patient denies any known insect bites or changes in her environment, including laundry detergent and new clothing. She has been using a steroid cream (Kenalog), which provides temporary relief but the itching returns if she forgets to apply it. The patient also reports that she has been experiencing cold sores, which occur every two or three years.       No past medical history on file.  Past Surgical History:  Procedure Laterality Date   APPENDECTOMY     CESAREAN SECTION      No family history on file.  Social History   Tobacco Use   Smoking status: Never   Smokeless tobacco: Never  Vaping Use   Vaping status: Never Used  Substance Use Topics   Alcohol use: No   Drug use: No     Allergies  Allergen Reactions   Other Swelling    Tree nuts- pine cone   Pistachio Nut (Diagnostic) Swelling   Shrimp [Shellfish Allergy] Swelling    Review of Systems NEGATIVE UNLESS OTHERWISE INDICATED IN HPI      Objective:     BP 100/68 (BP Location: Left Arm)   Pulse 84   Temp 97.7 F (36.5 C) (Temporal)   Ht 5\' 4"  (1.626 m)   Wt 139 lb 12.8 oz (63.4 kg)   SpO2 97%   BMI 24.00 kg/m   Wt Readings from Last 3  Encounters:  07/22/23 139 lb 12.8 oz (63.4 kg)  06/03/23 138 lb 2 oz (62.7 kg)  01/28/23 132 lb 6.4 oz (60.1 kg)    BP Readings from Last 3 Encounters:  07/22/23 100/68  06/03/23 100/67  01/28/23 98/62     Physical Exam Vitals and nursing note reviewed.  Constitutional:      Appearance: Normal appearance.  HENT:     Mouth/Throat:     Comments: Cold sore present R lateral lips Cardiovascular:     Rate and Rhythm: Normal rate and regular rhythm.  Pulmonary:     Effort: Pulmonary effort is normal.     Breath sounds: Normal breath sounds.  Skin:    Findings: Lesion (lower extremities diffuse excoritaed small red areas) and rash present.  Neurological:     Mental Status: She is alert.  Psychiatric:        Mood and Affect: Mood normal.        Assessment & Plan:  Skin rash -     Permethrin; Apply 1 Application topically once for 1 dose. Thoroughly massage cream from head to soles of feet; leave on for 8 to 14 hours (basically put on before bed and then shower in AM) before removing (shower  or bath). While you have this on- go ahead and clean all clothes/sheets in hot water. Repeat treatment in 14 days.  Dispense: 60 g; Refill: 1 -     CBC with Differential/Platelet -     Comprehensive metabolic panel -     TSH -     Ambulatory referral to Allergy  Itching -     CBC with Differential/Platelet -     Comprehensive metabolic panel -     TSH -     Ambulatory referral to Allergy  Cold sore     Assessment and Plan    Pruritic Rash Persistent itching and rash on lower extremities for one month. Partial response to Kenalog cream. No known allergies or recent travel. No similar symptoms in family members. Possible scabies considered. -Apply Permethrin cream neck down tonight, shower off in the morning, and repeat in two weeks. -Order blood work to rule out systemic causes of pruritus. -Refer to allergist for further evaluation.  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 Current episode since Saturday. -Continue over-the-counter treatment as needed. If frequency increases, consider prescription antiviral therapy.   F/up prn  Ginnie Marich M Allexa Acoff, PA-C

## 2023-07-28 NOTE — Progress Notes (Unsigned)
New Patient Note  RE: Leslie Shaw MRN: [id number] DOB: [birth year] Date of Office Visit: 07/29/2023  Consult requested by: Allwardt, Crist Infante, PA-C Primary care provider: Allwardt, Crist Infante, PA-C  Chief Complaint: Pruritus (Patient states that she has itching on both legs, has red bumps on them. States that it started in August.)  History of Present Illness: I had the pleasure of seeing Leslie Shaw for initial evaluation at the [facility] on 07/29/2023. She is a 37 y.o. female, who is referred here by Allwardt, Crist Infante, PA-C for the evaluation of pruritus.  Discussed the use of AI scribe software for clinical note transcription with the patient, who gave verbal consent to proceed.  The patient presents with a persistent pruritic rash on the legs that began in August. The rash was initially evaluated by a primary care provider who suggested it might be due to bug bites and prescribed triamcinolone cream and hydroxyzine. The patient discontinued the hydroxyzine due to excessive sedation but continued the triamcinolone, which provided some relief from the itching. However, the rash and itching return whenever the patient discontinues the cream. The rash is described as itchy and is localized to the legs with no involvement of the torso or upper extremities. The patient reports that the rash and itching are variable, with some days being completely symptom-free, while other days are characterized by intense itching. The patient denies any associated systemic symptoms, changes in diet, or new medications.   The patient uses Gain laundry detergent and fabric softener, which she has been advised to discontinue. The patient has a history of a reaction to shrimp, resulting in back itching, and a possible reaction to pine nuts, resulting in oral swelling. The patient denies any other food allergies and reports no issues with other nuts. The patient has no history of eczema, medication  allergies, or frequent infections requiring antibiotics.   The patient reports occasional seasonal allergies with nasal congestion in the spring. There are no similar symptoms in household contacts, no pets, and no history of recent illness or fever. The patient denies any changes in clothing or personal care products. The patient has had laser hair removal and uses conditioner for shaving, which does not appear to exacerbate the rash.   Assessment and Plan: Leslie Shaw is a 37 y.o. female with: Rash and other nonspecific skin eruption Pruritus Persistent itching and rash on the legs since August. The rash intermittently resolves completely. Previous treatment with hydroxyzine was effective but caused excessive sedation. Triamcinolone cream 0.1% provided temporary relief. Nobody else has this issue at home.  Etiology unclear but unlikely to be related to environmental or food allergies.  See below for proper skin care. Use fragrance free and dye free products. No dryer sheets or fabric softener.  Take zyrtec (cetirizine) 10mg  at bedtime for itching. May use benadryl cream as needed for itching.  Get bloodwork. If unremarkable and still persistent will refer to dermatology next.   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 Continue to avoid shrimp, pistachio, pine nuts.  Get bloodwork. For mild symptoms you can take over the counter antihistamines such as Benadryl 1-2 tablets = 25-50mg  and monitor symptoms closely.  If symptoms worsen or if you have severe symptoms including breathing issues, throat closure, significant swelling, whole body hives, severe diarrhea and vomiting, lightheadedness then seek immediate medical care.  Return in about 4  weeks (around 08/26/2023).  Meds ordered this encounter  Medications   cetirizine (ZYRTEC ALLERGY) 10 MG tablet    Sig:  Take 1 tablet (10 mg total) by mouth at bedtime as needed (itching).    Dispense:  30 tablet    Refill:  3   Lab Orders         Allergen Profile, Shellfish         Alpha-Gal Panel         ANA w/Reflex         IgE Nut Prof. w/Component Rflx         Food Allergy Profile         C3 and C4         Chronic Urticaria         Tryptase         C-reactive protein         Sedimentation rate         Pine Nut IgE      Other allergy screening: Asthma: no Rhino conjunctivitis: yes in the spring only. Food allergy: yes Medication allergy: no Hymenoptera allergy: no Eczema:no History of recurrent infections suggestive of immunodeficency: no  Diagnostics: None.   Past Medical History: Patient Active Problem List   Diagnosis Date Noted   Abnormal thyroid function test 07/30/2022   Paresthesias 07/30/2022   History reviewed. No pertinent past medical history. Past Surgical History: Past Surgical History:  Procedure Laterality Date   APPENDECTOMY     CESAREAN SECTION     Medication List:  Current Outpatient Medications  Medication Sig Dispense Refill   cetirizine (ZYRTEC ALLERGY) 10 MG tablet Take 1 tablet (10 mg total) by mouth at bedtime as needed (itching). 30 tablet 3   norgestimate-ethinyl estradiol (ORTHO-CYCLEN) 0.25-35 MG-MCG tablet Take 1 tablet by mouth daily. 28 tablet 11   triamcinolone cream (KENALOG) 0.1 % Apply 1 Application topically 2 (two) times daily. 30 g 0   omeprazole (PRILOSEC) 40 MG capsule Take 1 capsule (40 mg total) by mouth daily for 15 days. Take on an empty stomach 30-60 minutes prior to food. 15 capsule 1   No current facility-administered medications for this visit.   Allergies: Allergies  Allergen Reactions   Other Swelling    Tree nuts- pine cone   Pistachio Nut (Diagnostic) Swelling   Shrimp [Shellfish Allergy] Swelling   Social History: Social History   Socioeconomic History   Marital status: Married    Spouse name: Not on file    Number of children: Not on file   Years of education: Not on file   Highest education level: Not on file  Occupational History   Not on file  Tobacco Use   Smoking status: Some Days    Types: Cigarettes    Passive exposure: Never   Smokeless tobacco: Never  Vaping Use   Vaping status: Never Used  Substance and Sexual Activity   Alcohol use: No   Drug use: No   Sexual activity: Yes    Birth control/protection: Inserts  Other Topics Concern   Not on file  Social History Narrative   Not on file   Social Determinants of Health   Financial Resource Strain: Not on file  Food Insecurity: Not on file  Transportation Needs: Not on file  Physical Activity: Not on file  Stress: Not on file  Social Connections: Not on file   Lives in a house. Smoking: denies Occupation: not employed  Environmental HistorySurveyor, minerals in the  house: no Carpet in the family room: yes Carpet in the bedroom: yes Heating: gas Cooling: central Pet: no  Family History: History reviewed. No pertinent family history. Problem                               Relation Asthma                                   no Eczema                                no Food allergy                          no Allergic rhino conjunctivitis     no  Review of Systems  Constitutional:  Negative for appetite change, chills, fever and unexpected weight change.  HENT:  Negative for congestion and rhinorrhea.   Eyes:  Negative for itching.  Respiratory:  Negative for cough, chest tightness, shortness of breath and wheezing.   Cardiovascular:  Negative for chest pain.  Gastrointestinal:  Negative for abdominal pain.  Genitourinary:  Negative for difficulty urinating.  Skin:  Positive for rash.       pruritus  Neurological:  Negative for headaches.    Objective: BP 96/72 (BP Location: Right Arm, Patient Position: Sitting, Cuff Size: Normal)   Pulse 73   Temp 97.9 F (36.6 C) (Temporal)   Resp 14   Ht 5' 3.58"  (1.615 m)   Wt 140 lb 3.2 oz (63.6 kg)   SpO2 96%   BMI 24.38 kg/m  Body mass index is 24.38 kg/m. Physical Exam Vitals and nursing note reviewed.  Constitutional:      Appearance: Normal appearance. She is well-developed.  HENT:     Head: Normocephalic and atraumatic.     Right Ear: Tympanic membrane and external ear normal.     Left Ear: Tympanic membrane and external ear normal.     Nose: Nose normal.     Mouth/Throat:     Mouth: Mucous membranes are moist.     Pharynx: Oropharynx is clear.  Eyes:     Conjunctiva/sclera: Conjunctivae normal.  Cardiovascular:     Rate and Rhythm: Normal rate and regular rhythm.     Heart sounds: Normal heart sounds. No murmur heard.    No friction rub. No gallop.  Pulmonary:     Effort: Pulmonary effort is normal.     Breath sounds: Normal breath sounds. No wheezing, rhonchi or rales.  Musculoskeletal:     Cervical back: Neck supple.  Skin:    General: Skin is warm.     Findings: Rash present.     Comments: Few excoriation marks on lower extremities b/l.  Neurological:     Mental Status: She is alert and oriented to person, place, and time.  Psychiatric:        Behavior: Behavior normal.   The plan was reviewed with the patient/family, and all questions/concerned were addressed.  It was my pleasure to see Leslie Shaw today and participate in her care. Please feel free to contact me with any questions or concerns.  Sincerely,  Wyline Mood, DO Allergy & Immunology  [facility] office: [phone] [facility] office: [phone]

## 2023-07-29 ENCOUNTER — Other Ambulatory Visit: Payer: Self-pay

## 2023-07-29 ENCOUNTER — Ambulatory Visit: Payer: Medicaid Other | Admitting: Allergy

## 2023-07-29 ENCOUNTER — Encounter: Payer: Self-pay | Admitting: Allergy

## 2023-07-29 VITALS — BP 96/72 | HR 73 | Temp 97.9°F | Resp 14 | Ht 63.58 in | Wt 140.2 lb

## 2023-07-29 DIAGNOSIS — L299 Pruritus, unspecified: Secondary | ICD-10-CM

## 2023-07-29 DIAGNOSIS — R21 Rash and other nonspecific skin eruption: Secondary | ICD-10-CM | POA: Diagnosis not present

## 2023-07-29 DIAGNOSIS — T7819XD Other adverse food reactions, not elsewhere classified, subsequent encounter: Secondary | ICD-10-CM

## 2023-07-29 DIAGNOSIS — T781XXD Other adverse food reactions, not elsewhere classified, subsequent encounter: Secondary | ICD-10-CM | POA: Diagnosis not present

## 2023-07-29 MED ORDER — CETIRIZINE HCL 10 MG PO TABS: 10.0000 mg | ORAL_TABLET | Freq: Every evening | ORAL | 3 refills | Status: DC | PRN

## 2023-07-29 NOTE — Patient Instructions (Addendum)
Rash/itching Etiology unclear.  See below for proper skin care. Use fragrance free and dye free products. No dryer sheets or fabric softener.  Take zyrtec (cetirizine) 10mg  at bedtime for itching. May use benadryl cream as needed for itching.  Get bloodwork We are ordering labs, so please allow 1-2 weeks for the results to come back. With the newly implemented Cures Act, the labs might be visible to you at the same time that they become visible to me. However, I will not address the results until all of the results are back, so please be patient.  In the meantime, continue recommendations in your patient instructions, including avoidance measures (if applicable), until you hear from me.  Food allergy Continue to avoid shrimp, pistachio, pine nuts.  For mild symptoms you can take over the counter antihistamines such as Benadryl 1-2 tablets = 25-50mg  and monitor symptoms closely.  If symptoms worsen or if you have severe symptoms including breathing issues, throat closure, significant swelling, whole body hives, severe diarrhea and vomiting, lightheadedness then seek immediate medical care.  Follow up in 1 month or sooner if needed.  Skin care recommendations  Bath time: Always use lukewarm water. AVOID very hot or cold water. Keep bathing time to 5-10 minutes. Do NOT use bubble bath. Use a mild soap and use just enough to wash the dirty areas. Do NOT scrub skin vigorously.  After bathing, pat dry your skin with a towel. Do NOT rub or scrub the skin.  Moisturizers and prescriptions:  ALWAYS apply moisturizers immediately after bathing (within 3 minutes). This helps to lock-in moisture. Use the moisturizer several times a day over the whole body. Good summer moisturizers include: Aveeno, CeraVe, Cetaphil. Good winter moisturizers include: Aquaphor, Vaseline, Cerave, Cetaphil, Eucerin, Vanicream. When using moisturizers along with medications, the moisturizer should be applied about one  hour after applying the medication to prevent diluting effect of the medication or moisturize around where you applied the medications. When not using medications, the moisturizer can be continued twice daily as maintenance.  Laundry and clothing: Avoid laundry products with added color or perfumes. Use unscented hypo-allergenic laundry products such as Tide free, Cheer free & gentle, and All free and clear.  If the skin still seems dry or sensitive, you can try double-rinsing the clothes. Avoid tight or scratchy clothing such as wool. Do not use fabric softeners or dyer sheets.

## 2023-08-07 DIAGNOSIS — Z419 Encounter for procedure for purposes other than remedying health state, unspecified: Secondary | ICD-10-CM | POA: Diagnosis not present

## 2023-08-07 LAB — FOOD ALLERGY PROFILE
Allergen Corn, IgE: <0.1 kU/L
Clam IgE: <0.1 kU/L
Codfish IgE: <0.1 kU/L
Egg White IgE: <0.1 kU/L
Milk IgE: <0.1 kU/L
Scallop IgE: <0.1 kU/L
Sesame Seed IgE: <0.1 kU/L
Shrimp IgE: <0.1 kU/L
Soybean IgE: <0.1 kU/L
Wheat IgE: <0.1 kU/L

## 2023-08-07 LAB — C-REACTIVE PROTEIN: CRP: 4 mg/L (ref 0–10)

## 2023-08-07 LAB — ANA W/REFLEX: Anti Nuclear Antibody (ANA): NEGATIVE

## 2023-08-07 LAB — ALPHA-GAL PANEL
Allergen Lamb IgE: <0.1 kU/L
Beef IgE: <0.1 kU/L
IgE (Immunoglobulin E), Serum: 125 [IU]/mL (ref 6–495)
O215-IgE Alpha-Gal: <0.1 kU/L
Pork IgE: <0.1 kU/L

## 2023-08-07 LAB — IGE NUT PROF. W/COMPONENT RFLX
F017-IgE Hazelnut (Filbert): <0.1 kU/L
F018-IgE Brazil Nut: <0.1 kU/L
F020-IgE Almond: <0.1 kU/L
F202-IgE Cashew Nut: <0.1 kU/L
F203-IgE Pistachio Nut: <0.1 kU/L
F256-IgE Walnut: <0.1 kU/L
Macadamia Nut, IgE: <0.1 kU/L
Peanut, IgE: 0.14 kU/L — AB
Pecan Nut IgE: <0.1 kU/L

## 2023-08-07 LAB — PEANUT COMPONENTS
F352-IgE Ara h 8: <0.1 kU/L
F422-IgE Ara h 1: <0.1 kU/L
F423-IgE Ara h 2: <0.1 kU/L
F424-IgE Ara h 3: <0.1 kU/L
F427-IgE Ara h 9: 0.26 kU/L — AB
F447-IgE Ara h 6: <0.1 kU/L

## 2023-08-07 LAB — SEDIMENTATION RATE: Sed Rate: 11 mm/h (ref 0–32)

## 2023-08-07 LAB — ALLERGEN COMPONENT COMMENTS

## 2023-08-07 LAB — F253-IGE PINE NUT, PIGNOLES: Pine Nut IgE: <0.1 kU/L

## 2023-08-07 LAB — ALLERGEN PROFILE, SHELLFISH
F023-IgE Crab: <0.1 kU/L
F080-IgE Lobster: <0.1 kU/L
F290-IgE Oyster: <0.1 kU/L

## 2023-08-07 LAB — CHRONIC URTICARIA: cu index: 3.3 (ref <10)

## 2023-08-07 LAB — C3 AND C4
Complement C3, Serum: 140 mg/dL (ref 82–167)
Complement C4, Serum: 18 mg/dL (ref 12–38)

## 2023-08-07 LAB — TRYPTASE: Tryptase: 3.7 ug/L (ref 2.2–13.2)

## 2023-08-25 DIAGNOSIS — R09A2 Foreign body sensation, throat: Secondary | ICD-10-CM | POA: Diagnosis not present

## 2023-09-02 ENCOUNTER — Encounter: Payer: Self-pay | Admitting: Physician Assistant

## 2023-09-06 DIAGNOSIS — Z419 Encounter for procedure for purposes other than remedying health state, unspecified: Secondary | ICD-10-CM | POA: Diagnosis not present

## 2023-09-06 NOTE — Progress Notes (Unsigned)
Follow Up Note  RE: Leslie Shaw MRN: [id number] DOB: Jul 22, 1986 Date of Office Visit: 09/07/2023  Referring provider: Allwardt, Crist Infante, PA-C Primary care provider: Allwardt, Crist Infante, PA-C  Chief Complaint: No chief complaint on file.  History of Present Illness: I had the pleasure of seeing Leslie Shaw for a follow up visit at the [facility] on 09/06/2023. She is a 37 y.o. female, who is being followed for rash, pruritus, adverse food reaction. Her previous allergy office visit was on 07/29/2023 with Dr. Selena Shaw. Today is a regular follow up visit.  Discussed the use of AI scribe software for clinical note transcription with the patient, who gave verbal consent to proceed.  History of Present Illness           I reviewed the bloodwork. Blood count, kidney function, liver function, electrolytes, thyroid, autoimmune screener, inflammation markers, chronic urticaria index (checks for autoantibodies that trigger mast cells), tryptase (checks for mast cell issues) and alpha gal (checks for red meat allergy) were all normal which is great.   Peanut, tree nuts, pine nuts, shellfish and common food panel was all negative.   Based on these results no trigger found for your itching and rash.   Recommend to continue with zyrtec 10mg  at bedtime.   Avoid shellfish, pistachios and pine nuts due to positive clinical history. We can discuss at next visit regarding how to reintroduce these foods back into diet.  Rash and other nonspecific skin eruption Pruritus Persistent itching and rash on the legs since August. The rash intermittently resolves completely. Previous treatment with hydroxyzine was effective but caused excessive sedation. Triamcinolone cream 0.1% provided temporary relief. Nobody else has this issue at home.  Etiology unclear but unlikely to be related to environmental or food allergies.  See below for proper skin care. Use fragrance free and dye free  products. No dryer sheets or fabric softener.  Take zyrtec (cetirizine) 10mg  at bedtime for itching. May use benadryl cream as needed for itching.  Get bloodwork. If unremarkable and still persistent will refer to dermatology next.    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 Continue to avoid shrimp, pistachio, pine nuts.  Get bloodwork. For mild symptoms you can take over the counter antihistamines such as Benadryl 1-2 tablets = 25-50mg  and monitor symptoms closely.  If symptoms worsen or if you have severe symptoms including breathing issues, throat closure, significant swelling, whole body hives, severe diarrhea and vomiting, lightheadedness then seek immediate medical care.  Assessment and Plan: Leslie Shaw is a 37 y.o. female with: *** Assessment and Plan              No follow-ups on file.  No orders of the defined types were placed in this encounter.  Lab Orders  No laboratory test(s) ordered today    Diagnostics: Spirometry:  Tracings reviewed. Her effort: {Blank single:19197::"Good reproducible efforts.","It was hard to get consistent efforts and there is a question as to whether this reflects a maximal maneuver.","Poor effort, data can not be interpreted."} FVC: ***L FEV1: ***L, ***% predicted FEV1/FVC ratio: ***% Interpretation: {Blank single:19197::"Spirometry consistent with mild obstructive disease","Spirometry consistent with moderate obstructive disease","Spirometry consistent with severe obstructive disease","Spirometry consistent with possible restrictive disease","Spirometry consistent with mixed obstructive and restrictive disease","Spirometry uninterpretable due to technique","Spirometry consistent with normal pattern","No overt abnormalities noted given today's efforts"}.  Please see scanned spirometry results for  details.  Skin Testing: {Blank single:19197::"Select foods","Environmental allergy panel","Environmental allergy panel and select foods","Food allergy panel","None","Deferred due to recent antihistamines use"}. *** Results discussed with patient/family.   Medication List:  Current Outpatient Medications  Medication Sig Dispense Refill   cetirizine (ZYRTEC ALLERGY) 10 MG tablet Take 1 tablet (10 mg total) by mouth at bedtime as needed (itching). 30 tablet 3   norgestimate-ethinyl estradiol (ORTHO-CYCLEN) 0.25-35 MG-MCG tablet Take 1 tablet by mouth daily. 28 tablet 11   omeprazole (PRILOSEC) 40 MG capsule Take 1 capsule (40 mg total) by mouth daily for 15 days. Take on an empty stomach 30-60 minutes prior to food. 15 capsule 1   triamcinolone cream (KENALOG) 0.1 % Apply 1 Application topically 2 (two) times daily. 30 g 0   No current facility-administered medications for this visit.   Allergies: Allergies  Allergen Reactions   Other Swelling    Tree nuts- pine cone   Pistachio Nut (Diagnostic) Swelling   Shrimp [Shellfish Allergy] Swelling   I reviewed her past medical history, social history, family history, and environmental history and no significant changes have been reported from her previous visit.  Review of Systems  Constitutional:  Negative for appetite change, chills, fever and unexpected weight change.  HENT:  Negative for congestion and rhinorrhea.   Eyes:  Negative for itching.  Respiratory:  Negative for cough, chest tightness, shortness of breath and wheezing.   Cardiovascular:  Negative for chest pain.  Gastrointestinal:  Negative for abdominal pain.  Genitourinary:  Negative for difficulty urinating.  Skin:  Positive for rash.       pruritus  Neurological:  Negative for headaches.    Objective: There were no vitals taken for this visit. There is no height or weight on file to calculate BMI. Physical Exam Vitals and nursing note reviewed.  Constitutional:       Appearance: Normal appearance. She is well-developed.  HENT:     Head: Normocephalic and atraumatic.     Right Ear: Tympanic membrane and external ear normal.     Left Ear: Tympanic membrane and external ear normal.     Nose: Nose normal.     Mouth/Throat:     Mouth: Mucous membranes are moist.     Pharynx: Oropharynx is clear.  Eyes:     Conjunctiva/sclera: Conjunctivae normal.  Cardiovascular:     Rate and Rhythm: Normal rate and regular rhythm.     Heart sounds: Normal heart sounds. No murmur heard.    No friction rub. No gallop.  Pulmonary:     Effort: Pulmonary effort is normal.     Breath sounds: Normal breath sounds. No wheezing, rhonchi or rales.  Musculoskeletal:     Cervical back: Neck supple.  Skin:    General: Skin is warm.     Findings: Rash present.     Comments: Few excoriation marks on lower extremities b/l.  Neurological:     Mental Status: She is alert and oriented to person, place, and time.  Psychiatric:        Behavior: Behavior normal.    Previous notes and tests were reviewed. The plan was reviewed with the patient/family, and all questions/concerned were addressed.  It was my pleasure to see Dollie today and participate in her care. Please feel free to contact me with any questions or concerns.  Sincerely,  Wyline Mood, DO Allergy & Immunology  [facility] office: [phone] [facility] office: [phone]

## 2023-09-07 ENCOUNTER — Ambulatory Visit: Payer: Medicaid Other | Admitting: Allergy

## 2023-09-07 ENCOUNTER — Encounter: Payer: Self-pay | Admitting: Physician Assistant

## 2023-09-08 NOTE — Telephone Encounter (Signed)
Please see pics requested and advise

## 2023-09-25 DIAGNOSIS — Z20828 Contact with and (suspected) exposure to other viral communicable diseases: Secondary | ICD-10-CM | POA: Diagnosis not present

## 2023-09-25 DIAGNOSIS — B349 Viral infection, unspecified: Secondary | ICD-10-CM | POA: Diagnosis not present

## 2023-10-07 DIAGNOSIS — Z419 Encounter for procedure for purposes other than remedying health state, unspecified: Secondary | ICD-10-CM | POA: Diagnosis not present

## 2023-10-15 ENCOUNTER — Ambulatory Visit (INDEPENDENT_AMBULATORY_CARE_PROVIDER_SITE_OTHER): Payer: Medicaid Other | Admitting: Physician Assistant

## 2023-10-15 VITALS — BP 110/79 | HR 71 | Temp 97.2°F | Ht 63.58 in | Wt 143.8 lb

## 2023-10-15 DIAGNOSIS — R21 Rash and other nonspecific skin eruption: Secondary | ICD-10-CM

## 2023-10-15 DIAGNOSIS — L299 Pruritus, unspecified: Secondary | ICD-10-CM | POA: Diagnosis not present

## 2023-10-15 MED ORDER — BETAMETHASONE VALERATE 0.1 % EX CREA: TOPICAL_CREAM | Freq: Two times a day (BID) | CUTANEOUS | 0 refills | Status: AC

## 2023-10-15 NOTE — Patient Instructions (Signed)
 VISIT SUMMARY:  During today's visit, we discussed your persistent leg rash that has been ongoing since August or September. You have been using a cream and taking Zyrtec  at night, which has provided some relief, but the rash and itching have worsened.  YOUR PLAN:  -CHRONIC LEG RASH: A chronic leg rash is a persistent skin condition that has not improved with initial treatments. We will refer you to a dermatologist urgently due to the severity and duration of the rash. In the meantime, I am prescribing a stronger steroid cream to be applied twice daily for two weeks. Please avoid shaving your legs on the affected areas to prevent further irritation. Take only luke-warm showers, and immediately after showering, apply a thick ointment such as CeraVe or Lubriderm to your legs to prevent dryness. Avoid any fragrance soaps or detergents. Avoid baths to prevent over-dryness.

## 2023-10-15 NOTE — Progress Notes (Signed)
 Patient ID: Leslie Shaw, female    DOB: 09-05-1986, 38 y.o.   MRN: [id number]   Assessment & Plan:  Skin rash -     Ambulatory referral to Dermatology  Itching -     Ambulatory referral to Dermatology  Other orders -     Betamethasone  Valerate; Apply topically 2 (two) times daily for 14 days.  Dispense: 80 g; Refill: 0     Assessment and Plan    Chronic Leg Rash Persistent rash on both legs since August/September. No improvement with Zyrtec , permethrin  cream, or triamcinolone  cream. Possible allergic component, as patient has known peanut  allergy . -Refer to dermatology urgently due to chronicity and severity of rash. -Prescribe stronger steroid cream for use twice daily for two weeks. -Advise patient to avoid shaving legs and using soap on affected areas.    No follow-ups on file.    Subjective:    Chief Complaint  Patient presents with   Pruritis    Pt c/o of itching on legs and would like a referral, has occurred since August 2024. Pt has tried creams OTC that helped some. Pt would like to be referred to Dermatology      HPI Discussed the use of AI scribe software for clinical note transcription with the patient, who gave verbal consent to proceed.  History of Present Illness   The patient, with a history of allergies including peanuts, presents with a persistent leg rash that has been ongoing since August or September. The rash is described as the 'worst' and appears to be worsening. It is localized to the legs and is not present on other parts of the body. The patient has been using a cream and taking Zyrtec  (cetirizine ) at night, which has provided some relief. The patient also reports intense itching, particularly before sleep, which sometimes leads to bleeding due to scratching. The patient has been seen by an allergist, but no specific allergens were identified as a cause of the rash. The patient also mentions shaving her legs occasionally, but not regularly,  and without the use of shaving cream or gel.       No past medical history on file.  Past Surgical History:  Procedure Laterality Date   APPENDECTOMY     CESAREAN SECTION      No family history on file.  Social History   Tobacco Use   Smoking status: Some Days    Types: Cigarettes    Passive exposure: Never   Smokeless tobacco: Never  Vaping Use   Vaping status: Never Used  Substance Use Topics   Alcohol use: No   Drug use: No     Allergies  Allergen Reactions   Other Swelling    Tree nuts- pine cone   Pistachio Nut (Diagnostic) Swelling   Shrimp [Shellfish Allergy ] Swelling    Review of Systems NEGATIVE UNLESS OTHERWISE INDICATED IN HPI      Objective:     BP 110/79   Pulse 71   Temp (!) 97.2 F (36.2 C)   Ht 5' 3.58 (1.615 m)   Wt 143 lb 12.8 oz (65.2 kg)   SpO2 98%   BMI 25.01 kg/m   Wt Readings from Last 3 Encounters:  10/15/23 143 lb 12.8 oz (65.2 kg)  07/29/23 140 lb 3.2 oz (63.6 kg)  07/22/23 139 lb 12.8 oz (63.4 kg)    BP Readings from Last 3 Encounters:  10/15/23 110/79  07/29/23 96/72  07/22/23 100/68     Physical  Exam    See photo below of bilateral lower legs: Numerous areas of excoriation, underlying dry patches and slight erythema.  Overall well-appearing otherwise.        Deboraha Goar M Rendell Thivierge, PA-C

## 2023-10-29 ENCOUNTER — Encounter: Payer: Self-pay | Admitting: Physician Assistant

## 2023-10-29 ENCOUNTER — Other Ambulatory Visit: Payer: Self-pay | Admitting: Family

## 2023-10-29 DIAGNOSIS — R21 Rash and other nonspecific skin eruption: Secondary | ICD-10-CM

## 2023-11-07 DIAGNOSIS — Z419 Encounter for procedure for purposes other than remedying health state, unspecified: Secondary | ICD-10-CM | POA: Diagnosis not present

## 2023-11-25 ENCOUNTER — Other Ambulatory Visit: Payer: Self-pay | Admitting: Allergy

## 2023-12-02 DIAGNOSIS — H5213 Myopia, bilateral: Secondary | ICD-10-CM | POA: Diagnosis not present

## 2023-12-05 DIAGNOSIS — Z419 Encounter for procedure for purposes other than remedying health state, unspecified: Secondary | ICD-10-CM | POA: Diagnosis not present

## 2023-12-07 ENCOUNTER — Encounter: Payer: Self-pay | Admitting: Physician Assistant

## 2023-12-07 ENCOUNTER — Ambulatory Visit

## 2023-12-07 ENCOUNTER — Ambulatory Visit (INDEPENDENT_AMBULATORY_CARE_PROVIDER_SITE_OTHER): Payer: Medicaid Other | Admitting: Physician Assistant

## 2023-12-07 VITALS — BP 100/70 | HR 85 | Temp 97.0°F | Ht 63.58 in | Wt 139.8 lb

## 2023-12-07 DIAGNOSIS — R1900 Intra-abdominal and pelvic swelling, mass and lump, unspecified site: Secondary | ICD-10-CM

## 2023-12-07 DIAGNOSIS — R21 Rash and other nonspecific skin eruption: Secondary | ICD-10-CM | POA: Diagnosis not present

## 2023-12-07 DIAGNOSIS — M778 Other enthesopathies, not elsewhere classified: Secondary | ICD-10-CM | POA: Diagnosis not present

## 2023-12-07 MED ORDER — DICLOFENAC SODIUM 1 % EX GEL: 2.0000 g | Freq: Four times a day (QID) | CUTANEOUS | 1 refills | Status: DC

## 2023-12-07 MED ORDER — BETAMETHASONE VALERATE 0.1 % EX CREA: TOPICAL_CREAM | Freq: Two times a day (BID) | CUTANEOUS | 0 refills | Status: DC

## 2023-12-07 NOTE — Patient Instructions (Signed)
 VISIT SUMMARY:  Today, we discussed your concerns about a swollen area on your abdomen and right wrist pain. We also reviewed the progress of your leg rash.  YOUR PLAN:  -RIGHT WRIST TENDINITIS: Right wrist tendinitis is inflammation of the tendons in the wrist, often due to overuse or strain. Continue using Diclofenac gel up to four times daily, and apply ice or heat as needed for comfort. If symptoms persist, we may consider a referral to orthopedics.  -LEG RASH: Your leg rash has been improving with the use of Betamethasone cream. Continue using the cream as needed. A refill prescription has been sent to [facility].  -ABDOMINAL ASYMMETRY: You have noticed a swollen area on your abdomen without pain or changes in bowel movements. We will order an abdominal x-ray to further evaluate this and monitor for any changes.  INSTRUCTIONS:  Please follow up with [place] if your wrist pain does not improve or if you notice any changes in the swollen area on your abdomen. We will contact you with the results of your abdominal x-ray.

## 2023-12-07 NOTE — Progress Notes (Signed)
 Patient ID: Leslie Shaw, female    DOB: 1986/05/18, 38 y.o.   MRN: [id number]   Assessment & Plan:  Skin rash  Right wrist tendonitis -     Diclofenac Sodium; Apply 2 g topically 4 (four) times daily. Apply to the site of pain on the Right wrist.  Dispense: 50 g; Refill: 1  Abdominal swelling -     DG Abd 1 View; Future  Other orders -     Betamethasone Valerate; Apply topically 2 (two) times daily.  Dispense: 45 g; Refill: 0  Assessment and Plan    Right Wrist Tendinitis Pain in the right wrist, especially with lifting heavy objects. No palpable lumps or bumps. Likely due to overuse or strain. -Apply Diclofenac gel up to four times daily. -Consider referral to orthopedics if symptoms persist. -Apply ice or heat as needed for comfort.  Leg Rash Improvement noted with Betamethasone cream. -Continue Betamethasone cream as needed. -[facility]ill prescription sent to [facility].  Abdominal Asymmetry Patient noticed a difference in the left side of her abdomen compared to the right, with the left side appearing more swollen. No pain or changes in bowel movements. No history of abdominal surgeries. -Order abdominal x-ray to further evaluate. -Monitor for changes and reassess as needed.        Subjective:    Chief Complaint  Patient presents with   swelling stomach    Swelling in stomach area left side of stomach not painful has be going on since last week. Not getting any better. Also states that she is having right wrist pain when she is working off and on concerned about.    HPI Discussed the use of AI scribe software for clinical note transcription with the patient, who gave verbal consent to proceed.  History of Present Illness   Leslie Shaw is a 38 year old female who presents with concerns about a swollen area on her abdomen and right wrist pain.  She has noticed a swollen area on her abdomen for the past week without associated pain. There are no changes in bowel movements,  constipation, bloating, or acid reflux. She has no history of abdominal surgeries, though she had an appendectomy and a C-section many years ago. Her menstrual cycles remain normal.  She reports right wrist pain present for the past two months, occurring intermittently and lasting 30 to 50 minutes. The pain is exacerbated by activities such as cooking or holding heavy objects. No lumps or bumps are present on the wrist. She has not engaged in new exercises or ab workouts. She uses diclofenac gel up to four times daily for pain relief.  Additionally, she has a history of a rash on her legs, which has been improving with betamethasone cream. The cream has been effective in alleviating symptoms.       History reviewed. No pertinent past medical history.  Past Surgical History:  Procedure Laterality Date   APPENDECTOMY     CESAREAN SECTION      History reviewed. No pertinent family history.  Social History   Tobacco Use   Smoking status: Some Days    Types: Cigarettes    Passive exposure: Never   Smokeless tobacco: Never  Vaping Use   Vaping status: Never Used  Substance Use Topics   Alcohol use: No   Drug use: No     Allergies  Allergen Reactions   Other Swelling    Tree nuts- pine cone   Pistachio Nut (Diagnostic) Swelling  Shrimp [Shellfish Allergy] Swelling    Review of Systems NEGATIVE UNLESS OTHERWISE INDICATED IN HPI      Objective:     BP 100/70   Pulse 85   Temp (!) 97 F (36.1 C) (Temporal)   Ht 5' 3.58" (1.615 m)   Wt 139 lb 12.8 oz (63.4 kg)   SpO2 98%   BMI 24.31 kg/m   Wt Readings from Last 3 Encounters:  12/07/23 139 lb 12.8 oz (63.4 kg)  10/15/23 143 lb 12.8 oz (65.2 kg)  07/29/23 140 lb 3.2 oz (63.6 kg)    BP Readings from Last 3 Encounters:  12/07/23 100/70  10/15/23 110/79  07/29/23 96/72     Physical Exam Vitals and nursing note reviewed.  Constitutional:      Appearance: Normal appearance.  Eyes:     Extraocular Movements:  Extraocular movements intact.     Conjunctiva/sclera: Conjunctivae normal.     Pupils: Pupils are equal, round, and reactive to light.  Cardiovascular:     Rate and Rhythm: Normal rate and regular rhythm.     Pulses: Normal pulses.     Heart sounds: Normal heart sounds.  Pulmonary:     Effort: Pulmonary effort is normal.     Breath sounds: Normal breath sounds.  Abdominal:     General: Abdomen is flat. Bowel sounds are normal. There is no distension.     Palpations: Abdomen is soft. There is no mass.     Tenderness: There is no abdominal tenderness. There is no right CVA tenderness, left CVA tenderness, guarding or rebound.     Hernia: No hernia is present.  Musculoskeletal:        General: Tenderness (dorsal R wrist, no snuffbox tenderness; normal ROM) present.  Skin:    Findings: Rash (fading rash bilateral legs) present.  Neurological:     Mental Status: She is alert.  Psychiatric:        Mood and Affect: Mood normal.       Tannor Pyon M Inocente Krach, PA-C

## 2023-12-20 ENCOUNTER — Encounter: Payer: Self-pay | Admitting: Physician Assistant

## 2023-12-21 ENCOUNTER — Encounter: Payer: Self-pay | Admitting: Physician Assistant

## 2023-12-21 ENCOUNTER — Ambulatory Visit (INDEPENDENT_AMBULATORY_CARE_PROVIDER_SITE_OTHER): Admitting: Physician Assistant

## 2023-12-21 VITALS — BP 102/64 | HR 64 | Temp 97.2°F | Ht 63.0 in | Wt 138.2 lb

## 2023-12-21 DIAGNOSIS — R21 Rash and other nonspecific skin eruption: Secondary | ICD-10-CM | POA: Diagnosis not present

## 2023-12-21 DIAGNOSIS — K582 Mixed irritable bowel syndrome: Secondary | ICD-10-CM | POA: Diagnosis not present

## 2023-12-21 NOTE — Progress Notes (Signed)
 Patient ID: Leslie Shaw, female    DOB: Jun 25, 1986, 38 y.o.   MRN: [id number]   Assessment & Plan:  Irritable bowel syndrome with both constipation and diarrhea  Skin rash    Irritable Bowel Syndrome (IBS) IBS frequent diarrhea and occasional constipation per pt. No alarming symptoms reported. Managed with lifestyle modifications unless severe. XRAY reviewed with pt - shows constipation. - Increase water intake. - Engage in daily exercise, such as walking. - Introduce fiber supplement like Metamucil gradually. - Report if symptoms persist or worsen. - Monitor for alarming symptoms like weight loss or hematochezia. - Consider gastroenterology referral if no improvement.  Rash on legs - Resolved; monitor if any changes  General Health Maintenance Emphasized regular exercise and healthy lifestyle for overall health and bone health. - Encourage regular physical activity, starting with 15-20 minutes of brisk walking. - Consider progression to gym exercises, including weight machines.       Subjective:    Chief Complaint  Patient presents with   Medical Management of Chronic Issues    Pt in office for 2 wk f/u to look at skin rash; pt admits it has been better with cream; pt using it twice daily and seems to be helping    HPI Discussed the use of AI scribe software for clinical note transcription with the patient, who gave verbal consent to proceed.  History of Present Illness   Leslie Shaw is a 38 year old female with irritable bowel syndrome who presents with concerns about constipation and diarrhea.  She experiences frequent diarrhea, which she attributes to her irritable bowel syndrome (IBS). Her bowel movements are inconsistent, with some days being more constipated than others. She has not seen a gastrointestinal specialist nor had a colonoscopy. No excessive weight loss, blood in the stool, or fevers. She confirms frequent diarrhea and occasional  constipation.  Recently had XRAY - here for review.   Her rash, which was a concern previously, has significantly improved and is now resolved.  She does not engage in regular exercise, though she stays busy with housework and caring for her children.       History reviewed. No pertinent past medical history.  Past Surgical History:  Procedure Laterality Date   APPENDECTOMY     CESAREAN SECTION      History reviewed. No pertinent family history.  Social History   Tobacco Use   Smoking status: Former    Types: Cigarettes    Passive exposure: Never   Smokeless tobacco: Never  Vaping Use   Vaping status: Never Used  Substance Use Topics   Alcohol use: No   Drug use: No     Allergies  Allergen Reactions   Other Swelling    Tree nuts- pine cone   Pistachio Nut (Diagnostic) Swelling   Shrimp [Shellfish Allergy] Swelling    Review of Systems NEGATIVE UNLESS OTHERWISE INDICATED IN HPI      Objective:     BP 102/64 (BP Location: Left Arm, Patient Position: Sitting, Cuff Size: Normal)   Pulse 64   Temp (!) 97.2 F (36.2 C) (Temporal)   Ht 5\' 3"  (1.6 m)   Wt 138 lb 3.2 oz (62.7 kg)   LMP 12/05/2023   SpO2 97%   BMI 24.48 kg/m   Wt Readings from Last 3 Encounters:  12/21/23 138 lb 3.2 oz (62.7 kg)  12/07/23 139 lb 12.8 oz (63.4 kg)  10/15/23 143 lb 12.8 oz (65.2 kg)    BP  Readings from Last 3 Encounters:  12/21/23 102/64  12/07/23 100/70  10/15/23 110/79     Physical Exam Vitals and nursing note reviewed.  Constitutional:      Appearance: Normal appearance.  Eyes:     Extraocular Movements: Extraocular movements intact.     Conjunctiva/sclera: Conjunctivae normal.     Pupils: Pupils are equal, round, and reactive to light.  Cardiovascular:     Rate and Rhythm: Normal rate and regular rhythm.  Pulmonary:     Effort: Pulmonary effort is normal.     Breath sounds: Normal breath sounds.  Skin:    Findings: No lesion or rash.  Neurological:      Mental Status: She is alert.  Psychiatric:        Mood and Affect: Mood normal.        Behavior: Behavior normal.      Haven Pylant M Chenel Wernli, PA-C

## 2024-01-16 DIAGNOSIS — Z419 Encounter for procedure for purposes other than remedying health state, unspecified: Secondary | ICD-10-CM | POA: Diagnosis not present

## 2024-01-28 ENCOUNTER — Encounter: Payer: Self-pay | Admitting: Physician Assistant

## 2024-01-28 ENCOUNTER — Other Ambulatory Visit: Payer: Self-pay

## 2024-01-28 MED ORDER — BETAMETHASONE VALERATE 0.1 % EX CREA: TOPICAL_CREAM | Freq: Two times a day (BID) | CUTANEOUS | 0 refills | Status: DC

## 2024-01-28 NOTE — Telephone Encounter (Signed)
 Please see pt msg, Rx for cream sent for patient. Please advise on any referrals needed

## 2024-01-31 ENCOUNTER — Other Ambulatory Visit: Payer: Self-pay | Admitting: Physician Assistant

## 2024-01-31 DIAGNOSIS — R1900 Intra-abdominal and pelvic swelling, mass and lump, unspecified site: Secondary | ICD-10-CM

## 2024-01-31 DIAGNOSIS — K582 Mixed irritable bowel syndrome: Secondary | ICD-10-CM

## 2024-01-31 MED ORDER — BETAMETHASONE VALERATE 0.1 % EX CREA: TOPICAL_CREAM | Freq: Two times a day (BID) | CUTANEOUS | 0 refills | Status: DC

## 2024-02-02 ENCOUNTER — Ambulatory Visit: Payer: Self-pay

## 2024-02-02 NOTE — Telephone Encounter (Signed)
 Copied from CRM [id number]. Topic: Clinical - Red Word Triage >> Feb 02, 2024  9:42 AM Luane Rumps D wrote: Red Word that prompted transfer to Nurse Triage: Swelling in abdomen, not painful   Chief Complaint: abdominal swelling Symptoms: No pain Frequency: constant Pertinent Negatives: Patient denies pain Disposition: [] ED /[] Urgent Care (no appt availability in office) / [x] Appointment(In office/virtual)/ []  [facility]/ [] Home Care/ [] Refused Recommended Disposition /[] [facility]/ []  Follow-up with PCP Additional Notes: Abdoninal swelling since March, started taking fiber per doctor recommendation. States that the constipation got better, but the swelling persists. Appt scheduled with PCP on 4/30  Reason for Disposition  [1] MILD SWELLING of abdomen AND [2] present > 1 week  Answer Assessment - Initial Assessment Questions 1. SYMPTOM: "What's the main symptom you're concerned about?" (e.g., abdomen bloating, swelling)     Swelling  2. ONSET: "When did swelling start?"     Started in March  3. SEVERITY: "How bad is the bloating or swelling?"    - BLOATING: Feels gassy or bloated. No visible swelling.     - MILD SWELLING: Feels gassy or bloated. Abdomen looks mildly distended or swollen.    - MODERATE - SEVERE SWELLING: Abdomen looks very distended or swollen.      Mild swelling  4. ABDOMEN PAIN:  "Is there any abdomen pain?" If Yes, ask: "How bad is the pain?"  (e.g., Scale 1-10; mild, moderate, or severe)   - NONE (0): No pain.   - MILD (1-3): Doesn't interfere with normal activities, abdomen soft and not tender to touch.    - MODERATE (4-7): Interferes with normal activities or awakens from sleep, abdomen tender to touch.    - SEVERE (8-10): Excruciating pain, doubled over, unable to do any normal activities.       No pain  5. RELIEVING AND AGGRAVATING FACTORS: "What makes it better or worse?" (e.g., certain foods, lactose, medicines)     No  6. GI HISTORY:  "Do you have any history of stomach or intestine problems?" (e.g., bowel obstruction, cancer, irritable bowel)      Irritable bowel  7. CAUSE: "What do you think is causing the bloating?"      Unsure of cause  8. OTHER SYMPTOMS: "Do you have any other symptoms?" (e.g., belching, blood in stool, breathing difficulty, constipation, diarrhea, fever, passing gas, vomiting, weight loss, white of eyes have turned yellow)     No  9. PREGNANCY: "Is there any chance you are pregnant?" "When was your last menstrual period?"     No; LMP- 10 days ago  Protocols used: Abdomen Bloating and Swelling-A-AH

## 2024-02-02 NOTE — Telephone Encounter (Signed)
 Noted.

## 2024-02-03 ENCOUNTER — Encounter: Payer: Self-pay | Admitting: Physician Assistant

## 2024-02-03 ENCOUNTER — Ambulatory Visit (INDEPENDENT_AMBULATORY_CARE_PROVIDER_SITE_OTHER): Admitting: Physician Assistant

## 2024-02-03 VITALS — BP 102/70 | HR 89 | Temp 97.5°F | Resp 18 | Wt 140.0 lb

## 2024-02-03 DIAGNOSIS — E559 Vitamin D deficiency, unspecified: Secondary | ICD-10-CM | POA: Diagnosis not present

## 2024-02-03 DIAGNOSIS — R5383 Other fatigue: Secondary | ICD-10-CM | POA: Diagnosis not present

## 2024-02-03 DIAGNOSIS — R1900 Intra-abdominal and pelvic swelling, mass and lump, unspecified site: Secondary | ICD-10-CM | POA: Diagnosis not present

## 2024-02-03 DIAGNOSIS — R21 Rash and other nonspecific skin eruption: Secondary | ICD-10-CM

## 2024-02-03 DIAGNOSIS — Z1231 Encounter for screening mammogram for malignant neoplasm of breast: Secondary | ICD-10-CM

## 2024-02-03 DIAGNOSIS — K582 Mixed irritable bowel syndrome: Secondary | ICD-10-CM | POA: Diagnosis not present

## 2024-02-03 LAB — COMPREHENSIVE METABOLIC PANEL WITH GFR
ALT: 12 U/L (ref 0–35)
AST: 13 U/L (ref 0–37)
Albumin: 4.2 g/dL (ref 3.5–5.2)
Alkaline Phosphatase: 37 U/L — ABNORMAL LOW (ref 39–117)
BUN: 10 mg/dL (ref 6–23)
CO2: 21 meq/L (ref 19–32)
Calcium: 9 mg/dL (ref 8.4–10.5)
Chloride: 106 meq/L (ref 96–112)
Creatinine, Ser: 0.6 mg/dL (ref 0.40–1.20)
GFR: 112.65 mL/min (ref >60.00)
Glucose, Bld: 86 mg/dL (ref 70–99)
Potassium: 4 meq/L (ref 3.5–5.1)
Sodium: 137 meq/L (ref 135–145)
Total Bilirubin: 0.3 mg/dL (ref 0.2–1.2)
Total Protein: 6.9 g/dL (ref 6.0–8.3)

## 2024-02-03 LAB — CBC WITH DIFFERENTIAL/PLATELET
Basophils Absolute: 0 10*3/uL (ref 0.0–0.1)
Basophils Relative: 0.4 % (ref 0.0–3.0)
Eosinophils Absolute: 0 10*3/uL (ref 0.0–0.7)
Eosinophils Relative: 0.7 % (ref 0.0–5.0)
HCT: 44.5 % (ref 36.0–46.0)
Hemoglobin: 14.9 g/dL (ref 12.0–15.0)
Lymphocytes Relative: 42.2 % (ref 12.0–46.0)
Lymphs Abs: 2.5 10*3/uL (ref 0.7–4.0)
MCHC: 33.4 g/dL (ref 30.0–36.0)
MCV: 90.5 fl (ref 78.0–100.0)
Monocytes Absolute: 0.3 10*3/uL (ref 0.1–1.0)
Monocytes Relative: 4.4 % (ref 3.0–12.0)
Neutro Abs: 3.1 10*3/uL (ref 1.4–7.7)
Neutrophils Relative %: 52.3 % (ref 43.0–77.0)
Platelets: 235 10*3/uL (ref 150.0–400.0)
RBC: 4.91 Mil/uL (ref 3.87–5.11)
RDW: 13.3 % (ref 11.5–15.5)
WBC: 5.9 10*3/uL (ref 4.0–10.5)

## 2024-02-03 LAB — VITAMIN B12: Vitamin B-12: 172 pg/mL — ABNORMAL LOW (ref 211–911)

## 2024-02-03 LAB — VITAMIN D 25 HYDROXY (VIT D DEFICIENCY, FRACTURES): VITD: 14.5 ng/mL — ABNORMAL LOW (ref 30.00–100.00)

## 2024-02-03 MED ORDER — LORATADINE 10 MG PO TABS: 10.0000 mg | ORAL_TABLET | Freq: Every day | ORAL | 11 refills | Status: DC

## 2024-02-03 NOTE — Progress Notes (Signed)
 Patient ID: Leslie Shaw, female    DOB: 16-Nov-1985, 39 y.o.   MRN: [id number]   Assessment & Plan:  Irritable bowel syndrome with both constipation and diarrhea -     Ambulatory referral to Gastroenterology  Abdominal swelling -     Ambulatory referral to Gastroenterology  Encounter for screening mammogram for malignant neoplasm of breast -     3D Screening Mammogram, Left and Right; Future  Skin rash  Other fatigue -     VITAMIN D  25 Hydroxy (Vit-D Deficiency, Fractures) -     Vitamin B12 -     CBC with Differential/Platelet -     Comprehensive metabolic panel with GFR  Vitamin D  deficiency -     VITAMIN D  25 Hydroxy (Vit-D Deficiency, Fractures)  Other orders -     Loratadine; Take 1 tablet (10 mg total) by mouth daily.  Dispense: 30 tablet; Refill: 11      Assessment & Plan  Allergic rhinitis Allergic rhinitis managed with cetirizine , causing sedation. Considered loratadine to reduce drowsiness. - Discontinue cetirizine  - Initiate loratadine 10 mg daily  Fatigue possibly due to cetirizine  Fatigue and somnolence likely secondary to cetirizine . Plan to switch to loratadine. Vitamin D  levels to be evaluated. - Discontinue cetirizine  - Initiate loratadine - Evaluate vitamin D  & B12 levels  Irritable bowel syndrome Irritable bowel syndrome with symptom improvement on fiber. Concerns about bloating and flatulence, without pain. Potential gastroenterology referral and abdominal ultrasound discussed. - Refer to gastroenterologist for further evaluation - Consider abdominal ultrasound if indicated  Breast cancer screening Family history of breast cancer in maternal aunt. Discussed regular mammography starting at age 57. Referral for mammogram placed. - Schedule screening mammogram      Return for recheck/follow-up.    Subjective:    Chief Complaint  Patient presents with   Abdominal Pain    Pt C/O of abdominal bloating and discomfort for 2 months; no  pain is present; BM are normal; patient is taking fiber supplements   HM due- Pt will schedule mammogram with breast center    HPI Discussed the use of AI scribe software for clinical note transcription with the patient, who gave verbal consent to proceed.  History of Present Illness Leslie Shaw is a 38 year old female who presents with fatigue and sleepiness, possibly related to medication use.  She has been experiencing fatigue and excessive sleepiness for about a month. Despite sleeping from 11:00 to 11:30 PM until 6:00 to 6:30 AM, she feels very sleepy in the morning. She suspects this may be a side effect of cetirizine , which she has been taking for allergies for the past week.  She has a history of stomach issues, which have improved with the use of fiber. She experiences a lot of gas and regular bowel movements but no pain. She mentions a history of 'nervous colon' from 14 years ago, which caused significant pain at the time. She has not had a colonoscopy.  She received a letter regarding a mammogram due to breast pain in September 2023. Her gynecologist recommended annual mammograms. She has a family history of breast cancer; her maternal aunt was diagnosed a month ago. She has no personal history of breast cancer and no other family members with the condition.     History reviewed. No pertinent past medical history.  Past Surgical History:  Procedure Laterality Date   APPENDECTOMY     CESAREAN SECTION      Family History  Problem  Relation Age of Onset   Breast cancer Maternal Aunt     Social History   Tobacco Use   Smoking status: Former    Types: Cigarettes    Passive exposure: Never   Smokeless tobacco: Never  Vaping Use   Vaping status: Never Used  Substance Use Topics   Alcohol use: No   Drug use: No     Allergies  Allergen Reactions   Other Swelling    Tree nuts- pine cone   Pistachio Nut (Diagnostic) Swelling   Shrimp [Shellfish Allergy ] Swelling     Review of Systems NEGATIVE UNLESS OTHERWISE INDICATED IN HPI      Objective:     BP 102/70 (BP Location: Left Arm, Patient Position: Sitting, Cuff Size: Large)   Pulse 89   Temp (!) 97.5 F (36.4 C) (Temporal)   Resp 18   Wt 140 lb (63.5 kg)   LMP 01/13/2024 (Approximate)   SpO2 98%   BMI 24.80 kg/m   Wt Readings from Last 3 Encounters:  02/03/24 140 lb (63.5 kg)  12/21/23 138 lb 3.2 oz (62.7 kg)  12/07/23 139 lb 12.8 oz (63.4 kg)    BP Readings from Last 3 Encounters:  02/03/24 102/70  12/21/23 102/64  12/07/23 100/70     Physical Exam Vitals and nursing note reviewed.  Constitutional:      General: She is not in acute distress.    Appearance: Normal appearance. She is not ill-appearing.  HENT:     Head: Normocephalic.     Right Ear: External ear normal.     Left Ear: External ear normal.     Nose: No congestion.     Mouth/Throat:     Mouth: Mucous membranes are moist.     Pharynx: No oropharyngeal exudate or posterior oropharyngeal erythema.  Eyes:     Extraocular Movements: Extraocular movements intact.     Conjunctiva/sclera: Conjunctivae normal.     Pupils: Pupils are equal, round, and reactive to light.  Cardiovascular:     Rate and Rhythm: Normal rate and regular rhythm.     Pulses: Normal pulses.     Heart sounds: Normal heart sounds. No murmur heard. Pulmonary:     Effort: Pulmonary effort is normal. No respiratory distress.     Breath sounds: Normal breath sounds. No wheezing.  Abdominal:     Tenderness: There is no abdominal tenderness.  Musculoskeletal:     Cervical back: Normal range of motion.  Skin:    General: Skin is warm.  Neurological:     Mental Status: She is alert and oriented to person, place, and time.  Psychiatric:        Mood and Affect: Mood normal.        Behavior: Behavior normal.      Time Spent: 31 minutes of total time was spent on the date of the encounter performing the following actions: chart review prior  to seeing the patient, obtaining history, performing a medically necessary exam, counseling on the treatment plan, placing orders, and documenting in our EHR.     Daleena Rotter M Ziyan Hillmer, PA-C

## 2024-02-04 ENCOUNTER — Encounter: Payer: Self-pay | Admitting: Physician Assistant

## 2024-02-08 ENCOUNTER — Other Ambulatory Visit: Payer: Self-pay

## 2024-02-08 DIAGNOSIS — E559 Vitamin D deficiency, unspecified: Secondary | ICD-10-CM

## 2024-02-08 MED ORDER — VITAMIN D (ERGOCALCIFEROL) 1.25 MG (50000 UNIT) PO CAPS: 50000.0000 [IU] | ORAL_CAPSULE | ORAL | 0 refills | Status: DC

## 2024-02-08 NOTE — Telephone Encounter (Signed)
 Returned pt call and sent Rx to pharmacy pt requested; also scheduled B-12 injection (Nurse visit) in office. Nothing further needed at this time.

## 2024-02-08 NOTE — Telephone Encounter (Signed)
 Patient is needing a call back regarding her labs and vitamin b and d

## 2024-02-09 ENCOUNTER — Ambulatory Visit (INDEPENDENT_AMBULATORY_CARE_PROVIDER_SITE_OTHER)

## 2024-02-09 DIAGNOSIS — R5383 Other fatigue: Secondary | ICD-10-CM | POA: Diagnosis not present

## 2024-02-09 MED ORDER — CYANOCOBALAMIN 1000 MCG/ML IJ SOLN
1000.0000 ug | Freq: Once | INTRAMUSCULAR | Status: AC
  Administered 2024-02-09: 1000 ug via INTRAMUSCULAR

## 2024-02-09 NOTE — Progress Notes (Cosign Needed Addendum)
 Patient is in office today for a nurse visit for B12 Injection. Patient Injection was given in the  Left deltoid. Patient tolerated injection well.

## 2024-02-12 ENCOUNTER — Encounter: Payer: Self-pay | Admitting: Gastroenterology

## 2024-02-15 DIAGNOSIS — Z419 Encounter for procedure for purposes other than remedying health state, unspecified: Secondary | ICD-10-CM | POA: Diagnosis not present

## 2024-02-16 ENCOUNTER — Ambulatory Visit (INDEPENDENT_AMBULATORY_CARE_PROVIDER_SITE_OTHER)

## 2024-02-16 ENCOUNTER — Ambulatory Visit

## 2024-02-16 DIAGNOSIS — L309 Dermatitis, unspecified: Secondary | ICD-10-CM | POA: Diagnosis not present

## 2024-02-16 DIAGNOSIS — Z7952 Long term (current) use of systemic steroids: Secondary | ICD-10-CM | POA: Diagnosis not present

## 2024-02-16 DIAGNOSIS — E200 Idiopathic hypoparathyroidism: Secondary | ICD-10-CM | POA: Diagnosis not present

## 2024-02-16 DIAGNOSIS — J309 Allergic rhinitis, unspecified: Secondary | ICD-10-CM | POA: Diagnosis not present

## 2024-02-16 DIAGNOSIS — R5383 Other fatigue: Secondary | ICD-10-CM

## 2024-02-16 DIAGNOSIS — R030 Elevated blood-pressure reading, without diagnosis of hypertension: Secondary | ICD-10-CM | POA: Diagnosis not present

## 2024-02-16 DIAGNOSIS — Z793 Long term (current) use of hormonal contraceptives: Secondary | ICD-10-CM | POA: Diagnosis not present

## 2024-02-16 DIAGNOSIS — Z91013 Allergy to seafood: Secondary | ICD-10-CM | POA: Diagnosis not present

## 2024-02-16 MED ORDER — CYANOCOBALAMIN 1000 MCG/ML IJ SOLN
1000.0000 ug | Freq: Once | INTRAMUSCULAR | Status: AC
  Administered 2024-02-16: 1000 ug via INTRAMUSCULAR

## 2024-02-16 NOTE — Progress Notes (Signed)
 Patient is in office today for a nurse visit for B12 Injection. Patient Injection was given in the  Left deltoid. Patient tolerated injection well.

## 2024-02-23 ENCOUNTER — Ambulatory Visit (INDEPENDENT_AMBULATORY_CARE_PROVIDER_SITE_OTHER)

## 2024-02-23 DIAGNOSIS — R5383 Other fatigue: Secondary | ICD-10-CM | POA: Diagnosis not present

## 2024-02-23 MED ORDER — CYANOCOBALAMIN 1000 MCG/ML IJ SOLN
1000.0000 ug | Freq: Once | INTRAMUSCULAR | Status: AC
  Administered 2024-02-23: 1000 ug via INTRAMUSCULAR

## 2024-02-23 NOTE — Progress Notes (Signed)
 Patient is in office today for a nurse visit for B12 Injection. Patient Injection was given in the  Left deltoid. Patient tolerated injection well.

## 2024-02-25 ENCOUNTER — Ambulatory Visit
Admission: RE | Admit: 2024-02-25 | Discharge: 2024-02-25 | Disposition: A | Discharge status: Home / Self Care | Source: Ambulatory Visit | Attending: Physician Assistant | Admitting: Physician Assistant

## 2024-02-25 DIAGNOSIS — Z1231 Encounter for screening mammogram for malignant neoplasm of breast: Secondary | ICD-10-CM

## 2024-03-01 ENCOUNTER — Ambulatory Visit

## 2024-03-02 ENCOUNTER — Ambulatory Visit (INDEPENDENT_AMBULATORY_CARE_PROVIDER_SITE_OTHER): Admitting: *Deleted

## 2024-03-02 DIAGNOSIS — E538 Deficiency of other specified B group vitamins: Secondary | ICD-10-CM

## 2024-03-02 MED ORDER — CYANOCOBALAMIN 1000 MCG/ML IJ SOLN
1000.0000 ug | Freq: Once | INTRAMUSCULAR | Status: AC
  Administered 2024-03-02: 1000 ug via INTRAMUSCULAR

## 2024-03-02 NOTE — Progress Notes (Signed)
Patient presents for B12 injection today. Patient received her B12 injection in left Deltoid. Patient tolerated injection well.  Documentation entered in MAR in [employer].   

## 2024-03-03 ENCOUNTER — Encounter: Payer: Self-pay | Admitting: Physician Assistant

## 2024-03-04 NOTE — Telephone Encounter (Signed)
 After viewing mammogram results, showing no indication for need for follow up; please advise

## 2024-03-15 ENCOUNTER — Ambulatory Visit: Admitting: Gastroenterology

## 2024-03-15 ENCOUNTER — Encounter: Payer: Self-pay | Admitting: Gastroenterology

## 2024-03-15 ENCOUNTER — Other Ambulatory Visit

## 2024-03-15 VITALS — BP 112/70 | Ht 64.0 in | Wt 142.0 lb

## 2024-03-15 DIAGNOSIS — R1902 Left upper quadrant abdominal swelling, mass and lump: Secondary | ICD-10-CM

## 2024-03-15 DIAGNOSIS — R635 Abnormal weight gain: Secondary | ICD-10-CM | POA: Diagnosis not present

## 2024-03-15 DIAGNOSIS — K5900 Constipation, unspecified: Secondary | ICD-10-CM | POA: Diagnosis not present

## 2024-03-15 DIAGNOSIS — R140 Abdominal distension (gaseous): Secondary | ICD-10-CM

## 2024-03-15 NOTE — Progress Notes (Signed)
 Attending Physician's Attestation   I have reviewed the chart.   I agree with the Advanced Practitioner's note, impression, and recommendations with any updates as below.    Corliss Parish, MD [facility] Advanced Endoscopy Office # [phone]

## 2024-03-15 NOTE — Patient Instructions (Addendum)
 Your provider has requested that you go to the basement level for lab work before leaving today. Press "B" on the elevator. The lab is located at the first door on the left as you exit the elevator.  Start Miralax 1 capful daily - can increase or decrease as needed.  Can continue fiber supplement.  _______________________________________________________  If your blood pressure at your visit was 140/90 or greater, please contact your primary care physician to follow up on this.  _______________________________________________________  If you are age 20 or older, your body mass index should be between 23-30. Your Body mass index is 24.37 kg/m. If this is out of the aforementioned range listed, please consider follow up with your Primary Care Provider.  If you are age 9 or younger, your body mass index should be between 19-25. Your Body mass index is 24.37 kg/m. If this is out of the aformentioned range listed, please consider follow up with your Primary Care Provider.   ________________________________________________________  The [facility] providers would like to encourage you to use MYCHART to communicate with providers for non-urgent requests or questions.  Due to long hold times on the telephone, sending your provider a message by [facility] may be a faster and more efficient way to get a response.  Please allow 48 business hours for a response.  Please remember that this is for non-urgent requests.  _______________________________________________________

## 2024-03-15 NOTE — Progress Notes (Signed)
 Leslie Shaw [id number] Jun 17, 1986   Chief Complaint: Constipation, abdominal swelling  Referring Provider: Allwardt, Deleta Felix, PA-C Primary GI MD: Para Bold  HPI: Leslie Shaw is a 38 y.o. female with past medical history of vitamin D  deficiency, appendectomy and C-section who presents today for a complaint of IBS with constipation and diarrhea, and abdominal swelling.    Saw allergy  and asthma center in October for evaluation of a rash.  Had labs done with them at that time including TSH and allergen profiles.  Workup was negative including autoimmune markers, inflammatory markers, tryptase, alpha gal, nut and shellfish food panels.  She was advised to avoid shellfish, pistachios, and pine nuts due to positive clinical history and continue Zyrtec  10 mg at bedtime.  Abdominal x-ray done 12/2023 showed stool throughout the colon consistent with constipation.  02/03/2024 seen by PCP, noted to have IBS with symptom improvement on fiber but patient having concerns about bloating and flatulence, without pain.  Labs 02/03/2024: Normal CBC, normal CMP, vitamin B12 low 172, vitamin D  low 14.5  Patient is here today with her husband who helps interpret.  Her primary language is Arabic, she does speak some [place]. They declined interpretation services for the visit today.  Patient states she alternates between normal bowel movements and diarrhea with associated urgency.  She denies any blood in her stool or melena.  Denies hard stools or straining.  No clear pattern to her bowel movements or dietary triggers.  She has also been experiencing weight gain in the last 6 months and notes an area in her left upper quadrant which she says has appeared swollen.  She denies any abdominal pain.  Denies nausea, vomiting, heartburn.  Occasionally she may have some acid reflux if she eats something sour, but otherwise does not have any problems with this.  Denies dysphagia.  Since abdominal x-ray showed stool  throughout the colon and constipation, she has been taking a fiber supplement.  She has not noticed any significant change in her bowel habits on this.  States she eats a balanced diet and drinks water throughout the day.  Aside from doing housework, she does not get much exercise, though recently they purchased a treadmill and she has been walking on this a few times a week.  Previous GI Procedures/Imaging   XR Abdomen 12/07/2023  - Stool throughout the colon as can be seen with constipation.   Past Surgical History:  Procedure Laterality Date   APPENDECTOMY     CESAREAN SECTION      Current Outpatient Medications  Medication Sig Dispense Refill   betamethasone  valerate (VALISONE ) 0.1 % cream Apply topically 2 (two) times daily. 45 g 0   diclofenac  Sodium (VOLTAREN  ARTHRITIS PAIN) 1 % GEL Apply 2 g topically 4 (four) times daily. Apply to the site of pain on the Right wrist. 50 g 1   loratadine  (CLARITIN ) 10 MG tablet Take 1 tablet (10 mg total) by mouth daily. 30 tablet 11   norgestimate -ethinyl estradiol  (ORTHO-CYCLEN) 0.25-35 MG-MCG tablet Take 1 tablet by mouth daily. 28 tablet 11   omeprazole  (PRILOSEC) 40 MG capsule Take 1 capsule (40 mg total) by mouth daily for 15 days. Take on an empty stomach 30-60 minutes prior to food. 15 capsule 1   Vitamin D , Ergocalciferol , (DRISDOL ) 1.25 MG (50000 UNIT) CAPS capsule Take 1 capsule (50,000 Units total) by mouth every 7 (seven) days. 12 capsule 0   No current facility-administered medications for this visit.    Allergies as  of 03/15/2024 - Review Complete 02/03/2024  Allergen Reaction Noted   Other Swelling 12/09/2016   Pistachio nut (diagnostic) Swelling 02/27/2017   Shrimp [shellfish allergy ] Swelling 08/31/2017    Family History  Problem Relation Age of Onset   Breast cancer Maternal Aunt     Social History   Tobacco Use   Smoking status: Former    Types: Cigarettes    Passive exposure: Never   Smokeless tobacco: Never   Vaping Use   Vaping status: Never Used  Substance Use Topics   Alcohol use: No   Drug use: No     Review of Systems:    Constitutional: No weight loss, fever, chills, weakness or fatigue Cardiovascular: No chest pain, chest pressure or palpitations   Respiratory: No SOB or cough Gastrointestinal: See HPI and otherwise negative Genitourinary: No dysuria or change in urinary frequency Neurological: No headache, dizziness or syncope Musculoskeletal: No new muscle or joint pain Hematologic: No bleeding or bruising    Physical Exam:  Vital signs: BP 112/70   Ht 5\' 4"  (1.626 m)   Wt 142 lb (64.4 kg)   BMI 24.37 kg/m    Constitutional: NAD, Well developed, Well nourished, alert and cooperative Head:  Normocephalic and atraumatic.  Eyes: No scleral icterus. Conjunctiva pink. Mouth: No oral lesions. Respiratory: Respirations even and unlabored. Lungs clear to auscultation bilaterally.  No wheezes, crackles, or rhonchi.  Cardiovascular:  Regular rate and rhythm. No murmurs. No peripheral edema. Gastrointestinal:  Soft, nondistended, nontender.  No rebound or guarding. Swollen area that patient has described appears to be fatty deposition over the lower left ribs and is without erythema, ecchymosis, or tenderness. Similar appearance on the right side, though less noticeable. Normal bowel sounds. No appreciable masses or hepatomegaly. Rectal:  Not performed.  Neurologic:  Alert and oriented x4;  grossly normal neurologically.  Skin:   Dry and intact without significant lesions or rashes. Psychiatric: Oriented to person, place and time. Demonstrates good judgement and reason without abnormal affect or behaviors.   RELEVANT LABS AND IMAGING: CBC    Component Value Date/Time   WBC 5.9 02/03/2024 0919   RBC 4.91 02/03/2024 0919   HGB 14.9 02/03/2024 0919   HGB 11.4 03/10/2017 1610   HCT 44.5 02/03/2024 0919   HCT 35.7 03/10/2017 1610   PLT 235.0 02/03/2024 0919   PLT 214  03/10/2017 1610   MCV 90.5 02/03/2024 0919   MCV 89 03/10/2017 1610   MCH 29.2 07/15/2019 0229   MCHC 33.4 02/03/2024 0919   RDW 13.3 02/03/2024 0919   RDW 13.1 03/10/2017 1610   LYMPHSABS 2.5 02/03/2024 0919   LYMPHSABS 2.0 12/09/2016 1453   MONOABS 0.3 02/03/2024 0919   EOSABS 0.0 02/03/2024 0919   EOSABS 0.0 12/09/2016 1453   BASOSABS 0.0 02/03/2024 0919   BASOSABS 0.0 12/09/2016 1453    CMP     Component Value Date/Time   NA 137 02/03/2024 0919   K 4.0 02/03/2024 0919   CL 106 02/03/2024 0919   CO2 21 02/03/2024 0919   GLUCOSE 86 02/03/2024 0919   BUN 10 02/03/2024 0919   CREATININE 0.60 02/03/2024 0919   CALCIUM 9.0 02/03/2024 0919   PROT 6.9 02/03/2024 0919   ALBUMIN 4.2 02/03/2024 0919   AST 13 02/03/2024 0919   ALT 12 02/03/2024 0919   ALKPHOS 37 (L) 02/03/2024 0919   BILITOT 0.3 02/03/2024 0919   GFRNONAA >60 07/15/2019 0229   GFRAA >60 07/15/2019 0229     Assessment/Plan:  Constipation  Bloating Abdominal swelling Patient with constipation as noted on abdominal x-ray in March.  Has been taking fiber supplements without any noticeable improvement in bowel habits.  She denies hard stools or straining, but does have occasional diarrhea with associated urgency which sounds like it could be overflow due to underlying constipation.  Describes normal bowel movements outside of diarrheal episodes.  No blood in stool or melena and no abdominal or rectal pain.  She reports an area of swelling in her left upper quadrant which on exam appears to be fat deposition just over the left lower ribs.  She has had weight gain over the last year and noticed this "swelling" about 6 months ago. Similar appearance on the right side. Could be made more noticeable by abdominal bloating with constipation.   She had extensive laboratory workup with the asthma and allergy  clinic including normal TSH and negative inflammatory markers, negative alpha gal.  - Continue fiber supplement,  increased dietary fiber, adequate water intake, increased exercise/activity. - Start trial of Miralax 1 capful daily for constipation - Check labs: TTG, IgA - If no improvement at follow up can consider trial of Linzess - If alarm features or worsening constipation, consider colonoscopy.    Valiant Gaul, PA-C [facility] 03/15/2024, 12:16 PM  Patient Care Team: Allwardt, Alyssa M, PA-C as PCP - General (Physician Assistant)

## 2024-03-16 LAB — IGA: Immunoglobulin A: 102 mg/dL (ref 47–310)

## 2024-03-16 LAB — TISSUE TRANSGLUTAMINASE, IGA: (tTG) Ab, IgA: <1 U/mL

## 2024-03-17 ENCOUNTER — Ambulatory Visit: Payer: Self-pay | Admitting: Gastroenterology

## 2024-03-17 DIAGNOSIS — Z419 Encounter for procedure for purposes other than remedying health state, unspecified: Secondary | ICD-10-CM | POA: Diagnosis not present

## 2024-03-25 ENCOUNTER — Encounter: Payer: Self-pay | Admitting: Physician Assistant

## 2024-03-25 NOTE — Telephone Encounter (Signed)
 I don't see this listed on patient medication , did you want sent this ?

## 2024-03-31 ENCOUNTER — Ambulatory Visit

## 2024-03-31 DIAGNOSIS — E538 Deficiency of other specified B group vitamins: Secondary | ICD-10-CM | POA: Diagnosis not present

## 2024-03-31 MED ORDER — CYANOCOBALAMIN 1000 MCG/ML IJ SOLN
1000.0000 ug | Freq: Once | INTRAMUSCULAR | Status: AC
Start: 2024-03-31 — End: 2024-03-31
  Administered 2024-03-31: 1000 ug via INTRAMUSCULAR

## 2024-03-31 NOTE — Progress Notes (Signed)
 Patient is in office today for a nurse visit for B12 Injection. Patient Injection was given in the  Left deltoid. Patient tolerated injection well.

## 2024-04-10 ENCOUNTER — Other Ambulatory Visit: Payer: Self-pay | Admitting: Physician Assistant

## 2024-04-16 DIAGNOSIS — Z419 Encounter for procedure for purposes other than remedying health state, unspecified: Secondary | ICD-10-CM | POA: Diagnosis not present

## 2024-04-26 ENCOUNTER — Ambulatory Visit: Admitting: Gastroenterology

## 2024-05-13 ENCOUNTER — Ambulatory Visit

## 2024-05-17 DIAGNOSIS — Z419 Encounter for procedure for purposes other than remedying health state, unspecified: Secondary | ICD-10-CM | POA: Diagnosis not present

## 2024-05-19 ENCOUNTER — Ambulatory Visit (INDEPENDENT_AMBULATORY_CARE_PROVIDER_SITE_OTHER): Admitting: Gastroenterology

## 2024-05-19 ENCOUNTER — Encounter: Payer: Self-pay | Admitting: Gastroenterology

## 2024-05-19 VITALS — BP 100/70 | HR 78 | Ht 62.75 in | Wt 140.4 lb

## 2024-05-19 DIAGNOSIS — R140 Abdominal distension (gaseous): Secondary | ICD-10-CM | POA: Diagnosis not present

## 2024-05-19 DIAGNOSIS — K5900 Constipation, unspecified: Secondary | ICD-10-CM

## 2024-05-19 NOTE — Patient Instructions (Addendum)
 Continue fiber supplement.   Can take miralax as needed if constipation returns.   _______________________________________________________  If your blood pressure at your visit was 140/90 or greater, please contact your primary care physician to follow up on this.  _______________________________________________________  If you are age 40 or older, your body mass index should be between 23-30. Your Body mass index is 25.07 kg/m. If this is out of the aforementioned range listed, please consider follow up with your Primary Care Provider.  If you are age 93 or younger, your body mass index should be between 19-25. Your Body mass index is 25.07 kg/m. If this is out of the aformentioned range listed, please consider follow up with your Primary Care Provider.   ________________________________________________________  The [facility] providers would like to encourage you to use MYCHART to communicate with providers for non-urgent requests or questions.  Due to long hold times on the telephone, sending your provider a message by [facility] may be a faster and more efficient way to get a response.  Please allow 48 business hours for a response.  Please remember that this is for non-urgent requests.  _______________________________________________________  [facility] is using a team-based approach to care.  Your team is made up of your doctor and two to three APPS. Our APPS (Nurse Practitioners and Physician Assistants) work with your physician to ensure care continuity for you. They are fully qualified to address your health concerns and develop a treatment plan. They communicate directly with your gastroenterologist to care for you. Seeing the Advanced Practice Practitioners on your physician's team can help you by facilitating care more promptly, often allowing for earlier appointments, access to diagnostic testing, procedures, and other specialty referrals.

## 2024-05-19 NOTE — Progress Notes (Signed)
 Leslie Shaw [id number] 25-May-1986   Chief Complaint: Constipation  Referring Provider: Allwardt, Mardy HERO, PA-C Primary GI MD: Dr. Wilhelmenia  HPI: Leslie Shaw is a 38 y.o. female with past medical history of vitamin D  deficiency, appendectomy and C-section who presents today for a complaint of IBS with constipation and diarrhea, and abdominal swelling.     Saw allergy  and asthma center in October for evaluation of a rash.  Had labs done with them at that time including TSH and allergen profiles.  Workup was negative including autoimmune markers, inflammatory markers, tryptase, alpha gal, nut and shellfish food panels.  She was advised to avoid shellfish, pistachios, and pine nuts due to positive clinical history and continue Zyrtec  10 mg at bedtime.   Abdominal x-ray done 12/2023 showed stool throughout the colon consistent with constipation.   02/03/2024 seen by PCP, noted to have IBS with symptom improvement on fiber but patient having concerns about bloating and flatulence, without pain.   Labs 02/03/2024: Normal CBC, normal CMP, vitamin B12 low 172, vitamin D  low 14.5  Patient speaks Arabic and some [place].  Have previously declined interpretation services in favor of patient's husband helping to interpret.  At last visit patient was noted to have had constipation seen on prior abdominal x-ray in March.  Had taken fiber supplements without any noticeable improvement in bowel habits.  Denied hard stools or straining, but did endorse occasional diarrhea with associated urgency and there was question of possible overflow diarrhea due to underlying constipation.  She described normal bowel movements outside of diarrheal episodes.  Denied any blood in her stool or melena, no abdominal or rectal pain. She was concerned about an area of swelling in her LUQ which on exam appeared to be fat deposition just over the left lower ribs.  She had some weight gain over the previous year and noticed this  area of swelling about 6 months prior to visit.  Similar appearance noted on the right side. She has previously had extensive laboratory workup with asthma and allergy  clinic including normal TSH, negative inflammatory markers, negative alpha gal. She was advised to continue fiber supplement, increase dietary fiber and adequate water intake, increase exercise/activity.  Started her on a trial of MiraLAX 1 capful daily.  Labs negative for celiac disease.   Today patient is here with her son and daughter.  States that she is doing much better and constipation has improved.  Following last visit went on vacation and was eating a different diet, and subsequently noticed improvement in her bowel habits.  She has continued to take her fiber supplement daily.  States she is now having up to 3 normal bowel movements daily and denies any diarrhea, blood in her stool, melena.  Has not felt the need to start MiraLAX.  Also reports that her bloating has improved. States she has been told in the past that she has IBS.  Wants to know whether she needs a colonoscopy.  Denies any other concerns today.  Previous GI Procedures/Imaging   XR Abdomen 12/07/2023  - Stool throughout the colon as can be seen with constipation.    Past Medical History:  Diagnosis Date   Vitamin D  deficiency     Past Surgical History:  Procedure Laterality Date   APPENDECTOMY     CESAREAN SECTION      Current Outpatient Medications  Medication Sig Dispense Refill   FIBER GUMMIES PO Take 3 tablets by mouth daily. 6 g each  loratadine  (CLARITIN ) 10 MG tablet Take 10 mg by mouth daily.     norgestimate -ethinyl estradiol  (ORTHO-CYCLEN) 0.25-35 MG-MCG tablet TAKE 1 TABLET BY MOUTH EVERY DAY 84 tablet 0   polyethylene glycol powder (GLYCOLAX/MIRALAX) 17 GM/SCOOP powder Take 17 g by mouth as needed.     Vitamin D , Ergocalciferol , (DRISDOL ) 1.25 MG (50000 UNIT) CAPS capsule Take 1 capsule (50,000 Units total) by mouth every 7  (seven) days. 12 capsule 0   No current facility-administered medications for this visit.    Allergies as of 05/19/2024 - Review Complete 05/19/2024  Allergen Reaction Noted   Other Swelling 12/09/2016   Pistachio nut (diagnostic) Swelling 02/27/2017   Shrimp [shellfish allergy ] Swelling 08/31/2017    Family History  Problem Relation Age of Onset   Breast cancer Maternal Aunt     Social History   Tobacco Use   Smoking status: Former    Types: Cigarettes    Passive exposure: Never   Smokeless tobacco: Never  Vaping Use   Vaping status: Never Used  Substance Use Topics   Alcohol use: No   Drug use: No     Review of Systems:    Gastrointestinal: See HPI and otherwise negative   Physical Exam:  Vital signs: BP 100/70 (BP Location: Left Arm, Patient Position: Sitting, Cuff Size: Normal)   Pulse 78   Ht 5' 2.75 (1.594 m) Comment: height measured without shoes  Wt 140 lb 6.4 oz (63.7 kg)   LMP 05/10/2024   BMI 25.07 kg/m   Constitutional: Pleasant, well-appearing female in NAD, alert and cooperative Head:  Normocephalic and atraumatic.  Eyes: No scleral icterus.  Respiratory: Respirations even and unlabored.  Cardiovascular:  No peripheral edema. Gastrointestinal:  Soft, nondistended, nontender.  Neurologic:  Alert and oriented x4;  grossly normal neurologically.  Skin:   Dry and intact without significant lesions or rashes. Psychiatric: Oriented to person, place and time. Demonstrates good judgement and reason without abnormal affect or behaviors.   RELEVANT LABS AND IMAGING: CBC    Component Value Date/Time   WBC 5.9 02/03/2024 0919   RBC 4.91 02/03/2024 0919   HGB 14.9 02/03/2024 0919   HGB 11.4 03/10/2017 1610   HCT 44.5 02/03/2024 0919   HCT 35.7 03/10/2017 1610   PLT 235.0 02/03/2024 0919   PLT 214 03/10/2017 1610   MCV 90.5 02/03/2024 0919   MCV 89 03/10/2017 1610   MCH 29.2 07/15/2019 0229   MCHC 33.4 02/03/2024 0919   RDW 13.3 02/03/2024 0919    RDW 13.1 03/10/2017 1610   LYMPHSABS 2.5 02/03/2024 0919   LYMPHSABS 2.0 12/09/2016 1453   MONOABS 0.3 02/03/2024 0919   EOSABS 0.0 02/03/2024 0919   EOSABS 0.0 12/09/2016 1453   BASOSABS 0.0 02/03/2024 0919   BASOSABS 0.0 12/09/2016 1453    CMP     Component Value Date/Time   NA 137 02/03/2024 0919   K 4.0 02/03/2024 0919   CL 106 02/03/2024 0919   CO2 21 02/03/2024 0919   GLUCOSE 86 02/03/2024 0919   BUN 10 02/03/2024 0919   CREATININE 0.60 02/03/2024 0919   CALCIUM 9.0 02/03/2024 0919   PROT 6.9 02/03/2024 0919   ALBUMIN 4.2 02/03/2024 0919   AST 13 02/03/2024 0919   ALT 12 02/03/2024 0919   ALKPHOS 37 (L) 02/03/2024 0919   BILITOT 0.3 02/03/2024 0919   GFRNONAA >60 07/15/2019 0229   GFRAA >60 07/15/2019 0229     Assessment/Plan:   Constipation Bloating Patient here today for follow-up of  constipation and bloating.  States she is doing much better.  Has been taking fiber supplement daily.  Also recently made changes in her diet and this seems to have helped significantly with her constipation and bloating.  No longer feels bloated and is having up to 3 normal bowel movements daily.  Denies diarrhea, blood in her stool, melena.  Denies any abdominal pain.  We discussed that she will be due for screening colonoscopy at age 70, but advised her to let us  know if she has any new or worsening symptoms.  - Continue daily fiber supplement - Can use MiraLAX 1 capful daily as needed if recurrence of constipation - Follow-up as needed   Camie Furbish, PA-C [facility] 05/19/2024, 3:36 PM  Patient Care Team: Allwardt, Mardy HERO, PA-C as PCP - General (Physician Assistant)

## 2024-05-20 ENCOUNTER — Telehealth: Payer: Self-pay | Admitting: Gastroenterology

## 2024-05-20 NOTE — Telephone Encounter (Signed)
 Please make sure patient is placed on colonoscopy recall list for age 38, thank you.

## 2024-05-20 NOTE — Progress Notes (Signed)
 Attending Physician's Attestation   I have reviewed the chart.   I agree with the Advanced Practitioner's note, impression, and recommendations with any updates as below. Glad to hear that she is doing well and without evidence of anemia on recent labs.  Would place recall for age 38 into system for screening colonoscopy with me.  If other issues recur, she may be reseen in clinic but earlier colonoscopy may be required at that time.   Aloha Finner, MD [facility] Advanced Endoscopy Office # [phone]

## 2024-05-23 NOTE — Telephone Encounter (Signed)
Recall was placed. 

## 2024-06-09 ENCOUNTER — Encounter: Payer: Self-pay | Admitting: Dermatology

## 2024-06-09 ENCOUNTER — Ambulatory Visit: Payer: Medicaid Other | Admitting: Dermatology

## 2024-06-09 VITALS — BP 98/68

## 2024-06-09 DIAGNOSIS — L299 Pruritus, unspecified: Secondary | ICD-10-CM | POA: Diagnosis not present

## 2024-06-09 DIAGNOSIS — L249 Irritant contact dermatitis, unspecified cause: Secondary | ICD-10-CM

## 2024-06-09 DIAGNOSIS — L739 Follicular disorder, unspecified: Secondary | ICD-10-CM | POA: Diagnosis not present

## 2024-06-09 MED ORDER — HYDROXYZINE HCL 25 MG PO TABS: 12.5000 mg | ORAL_TABLET | Freq: Every day | ORAL | 3 refills | Status: DC

## 2024-06-09 MED ORDER — CLINDAMYCIN PHOSPHATE 1 % EX LOTN: TOPICAL_LOTION | Freq: Every day | CUTANEOUS | 3 refills | Status: AC

## 2024-06-09 MED ORDER — TRIAMCINOLONE ACETONIDE 0.1 % EX CREA: 1.0000 | TOPICAL_CREAM | Freq: Every day | CUTANEOUS | 3 refills | Status: AC | PRN

## 2024-06-09 NOTE — Patient Instructions (Addendum)
 Date: Thu Jun 09 2024  Hello Annice,  Thank you for visiting today. Here is a summary of the key instructions:  - Medications:   - Mix triamcinolone  and clindamycin  lotion and apply morning and night for 2 weeks   - After 2 weeks, use only clindamycin  for 2 weeks   - Then use clindamycin    - Hydroxyzine  25 mg at night for itching - start with half a tablet  - Skin Care:   - Do not shave until next visit  - Follow-up:   - Return for follow-up appointment in 2 months   Please reach out if you have any questions or concerns.  Warm regards,  Dr. [facility]   Important Information  Due to recent changes in healthcare laws, you may see results of your pathology and/or laboratory studies on MyChart before the doctors have had a chance to review them. We understand that in some cases there may be results that are confusing or concerning to you. Please understand that not all results are received at the same time and often the doctors may need to interpret multiple results in order to provide you with the best plan of care or course of treatment. Therefore, we ask that you please give us  2 business days to thoroughly review all your results before contacting the office for clarification. Should we see a critical lab result, you will be contacted sooner.   If You Need Anything After Your Visit  If you have any questions or concerns for your doctor, please call our main line at [phone] If no one answers, please leave a voicemail as directed and we will return your call as soon as possible. Messages left after 4 pm will be answered the following business day.   You may also send us  a message via MyChart. We typically respond to MyChart messages within 1-2 business days.  For prescription refills, please ask your pharmacy to contact our office. Our fax number is [phone].  If you have an urgent issue when the clinic is closed that cannot wait until the next business  day, you can page your doctor at the number below.    Please note that while we do our best to be available for urgent issues outside of office hours, we are not available 24/7.   If you have an urgent issue and are unable to reach us , you may choose to seek medical care at your doctor's office, retail clinic, urgent care center, or emergency room.  If you have a medical emergency, please immediately call 911 or go to the emergency department. In the event of inclement weather, please call our main line at [phone] for an update on the status of any delays or closures.  Dermatology Medication Tips: Please keep the boxes that topical medications come in in order to help keep track of the instructions about where and how to use these. Pharmacies typically print the medication instructions only on the boxes and not directly on the medication tubes.   If your medication is too expensive, please contact our office at [phone] or send us  a message through MyChart.   We are unable to tell what your co-pay for medications will be in advance as this is different depending on your insurance coverage. However, we may be able to find a substitute medication at lower cost or fill out paperwork to get insurance to cover a needed medication.   If a prior authorization is required to get your medication  covered by your insurance company, please allow us  1-2 business days to complete this process.  Drug prices often vary depending on where the prescription is filled and some pharmacies may offer cheaper prices.  The website www.goodrx.com contains coupons for medications through different pharmacies. The prices here do not account for what the cost may be with help from insurance (it may be cheaper with your insurance), but the website can give you the price if you did not use any insurance.  - You can print the associated coupon and take it with your prescription to the pharmacy.  - You may also stop  by our office during regular business hours and pick up a GoodRx coupon card.  - If you need your prescription sent electronically to a different pharmacy, notify our office through [facility] or by phone at [phone]

## 2024-06-09 NOTE — Progress Notes (Addendum)
   New Patient Visit   Subjective  Leslie Shaw is a 38 y.o. female who presents for the following: Rash of legs for about a year. It is itchy, worse at night. She was taking loratadine  and using betamethasone  but she has not used in the last month. She was treated once with permethrin  cream in October last year and it help a little but did not clear it.     The following portions of the chart were reviewed this encounter and updated as appropriate: medications, allergies, medical history  Review of Systems:  No other skin or systemic complaints except as noted in HPI or Assessment and Plan.  Objective  Well appearing patient in no apparent distress; mood and affect are within normal limits.   A focused examination was performed of the following areas:   Relevant exam findings are noted in the Assessment and Plan.          Assessment & Plan   FOLLICULITIS Exam: Perifollicular erythematous papules and pustules of legs  Folliculitis occurs due to inflammation of the superficial hair follicle (pore), resulting in acne-like lesions (pus bumps). It can be infectious (bacterial, fungal) or noninfectious (shaving, tight clothing, heat/sweat, medications).  Folliculitis can be acute or chronic and recommended treatment depends on the underlying cause of folliculitis.  Treatment Plan: -Clindamycin  1% lotion BID -Recommend she stop shaving legs until her follow up appointment.  IRRITANT CONTACT DERMATITIS Exam: Scaly pink papules and/or plaques of legs  - Assessment: Patient reports itchy rash on legs with severity scale of 10, worse at night. Previous treatments include permethrin  in October with slight improvement and betamethasone  with reduction in redness but incomplete symptom resolution. Examination reveals hair follicular papules consistent with folliculitis. Shaving routine with Venus razor and shower gel may be contributing factors. Vaseline application causes burning  sensation.  - Plan:    Mix triamcinolone  and clindamycin  lotion, apply morning and night for 2 weeks    After 2 weeks, use only clindamycin  for 2 weeks    Then use clindamycin  alone    Hydroxyzine  25 mg at night for itching, start with half a tablet    No shaving until next visit    Switch to Aveeno for skin care  Follow-up in 2 months to assess response to treatment modifications.     Return in about 2 months (around 08/09/2024) for folliculitis.  I, Roseline Hutchinson, CMA, am acting as scribe for [employer], DO .   Documentation: I have reviewed the above documentation for accuracy and completeness, and I agree with the above.  Delon Lenis, DO

## 2024-06-17 DIAGNOSIS — Z419 Encounter for procedure for purposes other than remedying health state, unspecified: Secondary | ICD-10-CM | POA: Diagnosis not present

## 2024-06-29 ENCOUNTER — Encounter: Payer: Self-pay | Admitting: Physician Assistant

## 2024-06-29 NOTE — Telephone Encounter (Signed)
 Please see pt msg and advise

## 2024-07-02 ENCOUNTER — Other Ambulatory Visit: Payer: Self-pay | Admitting: Dermatology

## 2024-07-14 ENCOUNTER — Other Ambulatory Visit: Payer: Self-pay | Admitting: Physician Assistant

## 2024-08-08 ENCOUNTER — Encounter: Payer: Self-pay | Admitting: Dermatology

## 2024-08-08 ENCOUNTER — Ambulatory Visit (INDEPENDENT_AMBULATORY_CARE_PROVIDER_SITE_OTHER): Admitting: Dermatology

## 2024-08-08 VITALS — BP 96/68 | HR 84

## 2024-08-08 DIAGNOSIS — L249 Irritant contact dermatitis, unspecified cause: Secondary | ICD-10-CM

## 2024-08-08 DIAGNOSIS — L810 Postinflammatory hyperpigmentation: Secondary | ICD-10-CM

## 2024-08-08 DIAGNOSIS — L739 Follicular disorder, unspecified: Secondary | ICD-10-CM

## 2024-08-08 NOTE — Patient Instructions (Addendum)
 VISIT SUMMARY:  Today, we discussed the skin irritation and bumps on your legs, as well as the dark spots on your face. You have shown improvement in your leg dermatitis, and we have provided a plan to help manage and further improve both conditions.  YOUR PLAN:  -IRRITANT CONTACT DERMATITIS OF THE LEGS: Irritant contact dermatitis is a skin reaction caused by exposure to irritating substances. Your leg dermatitis has improved, but there is still some redness.  Continue using clindamycin  every other day to prevent bumps.  You can restart shaving with Aveeno shave gel and a Gillette Mach 5 Fusion razor for sensitive skin. If bumps and itching return, mix clindamycin  with triamcinolone  for 1-2 weeks.  Consider exfoliating to help with trapped hair and redness. We provided samples of Roche-Posay Lipcar and Eucerin Advanced Repair moisturizers for use after showering.  -POST-INFLAMMATORY HYPERPIGMENTATION OF THE FACE:  Post-inflammatory hyperpigmentation is dark spots that appear on the skin after inflammation or injury, often worsened by pregnancy.  Use Eucerin Radiant Tone cream morning and night for 4-6 months to target these spots. Apply sunscreen with the cream for protection and continue using vitamin C in the morning.  INSTRUCTIONS:  Please follow up if you experience any worsening of symptoms or if you have any questions about the treatment plan.            Important Information  Due to recent changes in healthcare laws, you may see results of your pathology and/or laboratory studies on MyChart before the doctors have had a chance to review them. We understand that in some cases there may be results that are confusing or concerning to you. Please understand that not all results are received at the same time and often the doctors may need to interpret multiple results in order to provide you with the best plan of care or course of treatment. Therefore, we ask that you please give  us  2 business days to thoroughly review all your results before contacting the office for clarification. Should we see a critical lab result, you will be contacted sooner.   If You Need Anything After Your Visit  If you have any questions or concerns for your doctor, please call our main line at [phone] If no one answers, please leave a voicemail as directed and we will return your call as soon as possible. Messages left after 4 pm will be answered the following business day.   You may also send us  a message via MyChart. We typically respond to MyChart messages within 1-2 business days.  For prescription refills, please ask your pharmacy to contact our office. Our fax number is [phone].  If you have an urgent issue when the clinic is closed that cannot wait until the next business day, you can page your doctor at the number below.    Please note that while we do our best to be available for urgent issues outside of office hours, we are not available 24/7.   If you have an urgent issue and are unable to reach us , you may choose to seek medical care at your doctor's office, retail clinic, urgent care center, or emergency room.  If you have a medical emergency, please immediately call 911 or go to the emergency department. In the event of inclement weather, please call our main line at [phone] for an update on the status of any delays or closures.  Dermatology Medication Tips: Please keep the boxes that topical medications come in in order to help  keep track of the instructions about where and how to use these. Pharmacies typically print the medication instructions only on the boxes and not directly on the medication tubes.   If your medication is too expensive, please contact our office at [phone] or send us  a message through MyChart.   We are unable to tell what your co-pay for medications will be in advance as this is different depending on your insurance coverage.  However, we may be able to find a substitute medication at lower cost or fill out paperwork to get insurance to cover a needed medication.   If a prior authorization is required to get your medication covered by your insurance company, please allow us  1-2 business days to complete this process.  Drug prices often vary depending on where the prescription is filled and some pharmacies may offer cheaper prices.  The website www.goodrx.com contains coupons for medications through different pharmacies. The prices here do not account for what the cost may be with help from insurance (it may be cheaper with your insurance), but the website can give you the price if you did not use any insurance.  - You can print the associated coupon and take it with your prescription to the pharmacy.  - You may also stop by our office during regular business hours and pick up a GoodRx coupon card.  - If you need your prescription sent electronically to a different pharmacy, notify our office through [facility] or by phone at [phone]

## 2024-08-08 NOTE — Progress Notes (Signed)
   Follow-Up Visit   Subjective  Leslie Shaw is a 38 y.o. female who presents for the following: folliculitis/ irritant contact dermatitis  Patient was last evaluated on 06/09/24.  At this visit patient was prescribed Clindamycin  1% lotion and Triamcinolone  to mix and apply in AM for two weeks. Patient was also prescribed hydroxyzine  25mg  half of a tablet to take PM as needed for itching.  Patient was recommended to stop shaving legs until next appointment.   Patient reports sxs are better.  Patient denies medication changes. Patient report that topical creams helped with itching and helping to calm down the areas Patient reports she did not take the Hydroxyzine  as she did not need it for itching Patient reports she is currently just using clindamycin   The following portions of the chart were reviewed this encounter and updated as appropriate: medications, allergies, medical history  Review of Systems:  No other skin or systemic complaints except as noted in HPI or Assessment and Plan.  Objective  Well appearing patient in no apparent distress; mood and affect are within normal limits.  A focused examination was performed of the following areas: Bilateral legs  Relevant exam findings are noted in the Assessment and Plan.            Assessment & Plan    Irritant contact dermatitis of the legs Improvement in leg dermatitis with resolution of bumps and itching. Residual redness likely due to trapped hair and blood vessels, expected to resolve over time. Fair skin may prolong redness. - Continue clindamycin  application every other day to prevent recurrence of bumps. - Restart shaving with Aveeno shave gel and Gillette Mach 5 Fusion razor for sensitive skin. - If bumps and itching recur, mix clindamycin  and triamcinolone  for 1-2 weeks. - Consider exfoliation to help with trapped hair and redness. - Provided samples of Roche-Posay Lipcar and Eucerin Advanced Repair moisturizers  for post-shower use.  Post-inflammatory hyperpigmentation of the face Dark spots on the face, likely post-inflammatory hyperpigmentation, possibly exacerbated during pregnancy. Eucerin Radiant Tone cream recommended for its effectiveness in targeting pigment without irritation or side effects associated with hydroquinone. - Use Eucerin Radiant Tone cream morning and night for 4-6 months. - Apply sunscreen with medication for protection and treatment. - Continue using vitamin C in the morning alongside Eucerin Radiant Tone.   Return if symptoms worsen or fail to improve.  I, Lyle Cords, as acting as a neurosurgeon for [employer], DO .   Documentation: I have reviewed the above documentation for accuracy and completeness, and I agree with the above.  Delon Lenis, DO

## 2024-08-23 ENCOUNTER — Ambulatory Visit: Payer: Self-pay | Admitting: Physician Assistant

## 2024-08-23 ENCOUNTER — Encounter: Payer: Self-pay | Admitting: Physician Assistant

## 2024-08-23 ENCOUNTER — Ambulatory Visit: Admitting: Physician Assistant

## 2024-08-23 VITALS — BP 102/84 | HR 82 | Temp 97.4°F | Resp 14 | Ht 64.0 in | Wt 137.4 lb

## 2024-08-23 DIAGNOSIS — N939 Abnormal uterine and vaginal bleeding, unspecified: Secondary | ICD-10-CM

## 2024-08-23 LAB — CBC WITH DIFFERENTIAL/PLATELET
Basophils Absolute: 0 K/uL (ref 0.0–0.1)
Basophils Relative: 0.5 % (ref 0.0–3.0)
Eosinophils Absolute: 0 K/uL (ref 0.0–0.7)
Eosinophils Relative: 0.3 % (ref 0.0–5.0)
HCT: 43 % (ref 36.0–46.0)
Hemoglobin: 14.8 g/dL (ref 12.0–15.0)
Lymphocytes Relative: 39.6 % (ref 12.0–46.0)
Lymphs Abs: 2.5 K/uL (ref 0.7–4.0)
MCHC: 34.3 g/dL (ref 30.0–36.0)
MCV: 87.4 fl (ref 78.0–100.0)
Monocytes Absolute: 0.3 K/uL (ref 0.1–1.0)
Monocytes Relative: 4.4 % (ref 3.0–12.0)
Neutro Abs: 3.5 K/uL (ref 1.4–7.7)
Neutrophils Relative %: 55.2 % (ref 43.0–77.0)
Platelets: 210 K/uL (ref 150.0–400.0)
RBC: 4.92 Mil/uL (ref 3.87–5.11)
RDW: 13.7 % (ref 11.5–15.5)
WBC: 6.3 K/uL (ref 4.0–10.5)

## 2024-08-23 LAB — COMPREHENSIVE METABOLIC PANEL WITH GFR
ALT: 10 U/L (ref 0–35)
AST: 14 U/L (ref 0–37)
Albumin: 4.3 g/dL (ref 3.5–5.2)
Alkaline Phosphatase: 40 U/L (ref 39–117)
BUN: 7 mg/dL (ref 6–23)
CO2: 25 meq/L (ref 19–32)
Calcium: 8.8 mg/dL (ref 8.4–10.5)
Chloride: 102 meq/L (ref 96–112)
Creatinine, Ser: 0.61 mg/dL (ref 0.40–1.20)
GFR: 111.77 mL/min (ref >60.00)
Glucose, Bld: 74 mg/dL (ref 70–99)
Potassium: 3.6 meq/L (ref 3.5–5.1)
Sodium: 137 meq/L (ref 135–145)
Total Bilirubin: 0.3 mg/dL (ref 0.2–1.2)
Total Protein: 6.9 g/dL (ref 6.0–8.3)

## 2024-08-23 LAB — POCT URINE PREGNANCY: Preg Test, Ur: NEGATIVE

## 2024-08-23 LAB — TSH: TSH: 2.42 u[IU]/mL (ref 0.35–5.50)

## 2024-08-23 MED ORDER — MEDROXYPROGESTERONE ACETATE 10 MG PO TABS: 10.0000 mg | ORAL_TABLET | Freq: Three times a day (TID) | ORAL | 0 refills | Status: AC

## 2024-08-23 NOTE — Progress Notes (Signed)
 Patient ID: Leslie Shaw, female    DOB: 10-19-1985, 38 y.o.   MRN: [id number]   Assessment & Plan:  Abnormal uterine bleeding -     POCT urine pregnancy -     CBC with Differential/Platelet -     Comprehensive metabolic panel with GFR -     TSH      Assessment & Plan Abnormal uterine bleeding Intermittent light bleeding for almost three weeks since October 30th, 2025, despite consistent use of orthocycline for two years. No missed pills reported. No significant pain or dyspareunia.  - Ordered urine pregnancy test - Ordered TSH to evaluate thyroid  function - Consulted gynecology for potential ultrasound or addition of Provera to manage bleeding - Will communicate further instructions via MyChart    F/up prn     Subjective:    Chief Complaint  Patient presents with   Contraception    Wants to change due to periods still coming.     HPI Discussed the use of AI scribe software for clinical note transcription with the patient, who gave verbal consent to proceed.  History of Present Illness Leslie Shaw is a 38 year old female who presents with irregular bleeding while on birth control.  She has been experiencing irregular bleeding while on Ortho-Cyclen, which she has used for about two years. This is the first time she has encountered such issues since starting this regimen. Her periods typically last five to six days, but she has been experiencing prolonged bleeding since August 04, 2024, lasting almost three weeks. The bleeding is light, requiring only daily pads or tissues, but it is persistent.  She denies missing any birth control pills and takes them consistently between 11 PM and midnight. She has a history of using birth control pills for over twelve years, maintaining a consistent schedule.  She experiences daily stomach pain, often in the morning or at night, and sometimes when she is tired. No pain during intercourse, as she abstains during her period due to  religious practices.  She has a past medical history of ovarian cysts approximately twenty years ago, which resolved after a month of treatment with Yasmin birth control. She denies any recent urinary issues or unusual discharge, although she previously consulted a doctor for a discharge issue.  She adheres to religious practices that restrict certain activities during menstruation, such as fasting and praying, impacting her daily life during prolonged bleeding episodes.     Past Medical History:  Diagnosis Date   Vitamin D  deficiency     Past Surgical History:  Procedure Laterality Date   APPENDECTOMY     CESAREAN SECTION      Family History  Problem Relation Age of Onset   Breast cancer Maternal Aunt     Social History   Tobacco Use   Smoking status: Former    Types: Cigarettes    Passive exposure: Never   Smokeless tobacco: Never  Vaping Use   Vaping status: Never Used  Substance Use Topics   Alcohol use: No   Drug use: No     Allergies  Allergen Reactions   Other Swelling    Tree nuts- pine cone   Pistachio Nut (Diagnostic) Swelling   Shrimp [Shellfish Allergy ] Swelling    Review of Systems NEGATIVE UNLESS OTHERWISE INDICATED IN HPI      Objective:     BP 102/84   Pulse 82   Temp (!) 97.4 F (36.3 C) (Temporal)   Resp 14  Ht 5' 4 (1.626 m)   Wt 137 lb 6.4 oz (62.3 kg)   LMP 07/09/2024   SpO2 97%   BMI 23.58 kg/m   Wt Readings from Last 3 Encounters:  08/23/24 137 lb 6.4 oz (62.3 kg)  05/19/24 140 lb 6.4 oz (63.7 kg)  03/15/24 142 lb (64.4 kg)    BP Readings from Last 3 Encounters:  08/23/24 102/84  08/08/24 96/68  06/09/24 98/68     Physical Exam Vitals and nursing note reviewed.  Constitutional:      General: She is not in acute distress.    Appearance: Normal appearance. She is not ill-appearing.  HENT:     Head: Normocephalic and atraumatic.  Cardiovascular:     Rate and Rhythm: Normal rate and regular rhythm.      Pulses: Normal pulses.     Heart sounds: Normal heart sounds.  Pulmonary:     Effort: Pulmonary effort is normal.     Breath sounds: Normal breath sounds.  Abdominal:     General: Abdomen is flat. Bowel sounds are normal.     Palpations: Abdomen is soft.     Tenderness: There is no right CVA tenderness or left CVA tenderness.  Skin:    General: Skin is warm and dry.  Neurological:     General: No focal deficit present.     Mental Status: She is alert.  Psychiatric:        Mood and Affect: Mood normal.             Dawnna Gritz M Vernadette Stutsman, PA-C

## 2024-08-25 NOTE — Progress Notes (Signed)
 Spoke with patient and she stated understanding about taking the medication that was sent to the Pharmacy. She will also reach out if the bleeding does not improve.

## 2024-09-27 ENCOUNTER — Encounter: Payer: Self-pay | Admitting: Physician Assistant

## 2024-09-27 ENCOUNTER — Encounter: Admitting: Physician Assistant

## 2024-09-28 NOTE — Progress Notes (Signed)
 Rescheduled due to sick child

## 2024-10-14 ENCOUNTER — Other Ambulatory Visit (HOSPITAL_COMMUNITY)
Admission: RE | Admit: 2024-10-14 | Discharge: 2024-10-14 | Disposition: A | Source: Ambulatory Visit | Attending: Physician Assistant | Admitting: Physician Assistant

## 2024-10-14 ENCOUNTER — Encounter: Payer: Self-pay | Admitting: Physician Assistant

## 2024-10-14 ENCOUNTER — Ambulatory Visit: Admitting: Physician Assistant

## 2024-10-14 VITALS — BP 102/70 | HR 80 | Temp 97.2°F | Ht 64.0 in | Wt 142.6 lb

## 2024-10-14 DIAGNOSIS — Z124 Encounter for screening for malignant neoplasm of cervix: Secondary | ICD-10-CM | POA: Insufficient documentation

## 2024-10-14 DIAGNOSIS — Z0001 Encounter for general adult medical examination with abnormal findings: Secondary | ICD-10-CM

## 2024-10-14 DIAGNOSIS — N6312 Unspecified lump in the right breast, upper inner quadrant: Secondary | ICD-10-CM

## 2024-10-14 DIAGNOSIS — Z01419 Encounter for gynecological examination (general) (routine) without abnormal findings: Secondary | ICD-10-CM

## 2024-10-14 DIAGNOSIS — N393 Stress incontinence (female) (male): Secondary | ICD-10-CM

## 2024-10-14 DIAGNOSIS — Z803 Family history of malignant neoplasm of breast: Secondary | ICD-10-CM | POA: Diagnosis not present

## 2024-10-14 NOTE — Progress Notes (Unsigned)
 "   Patient ID: Leslie Shaw, female    DOB: 1985/12/29, 39 y.o.   MRN: [id number]   Assessment & Plan:  Well woman exam with routine gynecological exam  Screening for cervical cancer -     Cytology - PAP  Mass of upper inner quadrant of right breast -     US  LIMITED ULTRASOUND INCLUDING AXILLA RIGHT BREAST; Future -     MM 3D DIAGNOSTIC MAMMOGRAM BILATERAL BREAST; Future  Family history of breast cancer in female -     [place]  LIMITED ULTRASOUND INCLUDING AXILLA RIGHT BREAST; Future -     MM 3D DIAGNOSTIC MAMMOGRAM BILATERAL BREAST; Future      Assessment and Plan Assessment & Plan Woman's Wellness Visit Routine annual well woman exam. Pap smear performed. Gynecologic exams normal. Periods regular with previous abnormal bleeding managed with Provera . Family history of breast cancer in maternal aunt, possibly BRCA-related. - Continue annual Pap smears every three years - Discussed genetic testing for BRCA genes through [facility] program - Consider life insurance before genetic testing  Right breast mass, upper inner quadrant Palpable mass in right breast, upper inner quadrant near areola. Less than 0.5 cm, firm, not very mobile, non-tender, deep to palpation, best felt supine. Family history of breast cancer in maternal aunt, possibly BRCA-related. 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 Previous pessary use ineffective. No current indication for surgery. Symptoms include mild urinary incontinence with coughing or sneezing. - Continue Kegel exercises - Monitor for worsening symptoms such as pelvic pressure, pain, or increased urinary frequency - Will consider surgical evaluation if symptoms worsen      Return in about 1 year (around 10/14/2025) for physical, fasting labs .    Subjective:    Chief Complaint  Patient  presents with   Gynecologic Exam    Pt in office for pap smear; pt wanting to discuss birth control    Gynecologic Exam   Discussed the use of AI scribe software for clinical note transcription with the patient, who gave verbal consent to proceed.  History of Present Illness Leslie Shaw is a 39 year old female who presents for her annual well woman exam and Pap smear.  She has a persistent cough that has been ongoing. Her flu test was negative eight days ago, although her husband recently had the flu. No other significant symptoms aside from the cough.  She is currently on birth control, specifically ortho cyclen, and her periods are regular. She previously experienced abnormal bleeding, which was managed with a course of Provera  10 mg.  She has a history of urinary incontinence, particularly when coughing or sneezing, which was evaluated previously. She experiences occasional urinary leakage.  She had a mammogram in May of last year and is due for her next mammogram in May of this year. She has a family history of breast cancer; her maternal aunt was diagnosed with breast cancer this year and underwent chemotherapy and surgery. The aunt's cancer was noted to be genetic.  She has a history of HPV detected in 2019 with normal cytology, and her Pap smear in 2021 was normal with negative HPV.     Past Medical History:  Diagnosis Date   Vitamin D  deficiency     Past Surgical History:  Procedure Laterality Date   APPENDECTOMY     CESAREAN SECTION  Family History  Problem Relation Age of Onset   Breast cancer Maternal Aunt        possible BRCA positive as patient describes situation    Social History[1]   Allergies[2]  Review of Systems NEGATIVE UNLESS OTHERWISE INDICATED IN HPI      Objective:     BP 102/70   Pulse 80   Temp (!) 97.2 F (36.2 C) (Temporal)   Ht 5' 4 (1.626 m)   Wt 142 lb 9.6 oz (64.7 kg)   LMP 09/21/2024 (Approximate)   SpO2 98%   BMI  24.48 kg/m   Wt Readings from Last 3 Encounters:  10/14/24 142 lb 9.6 oz (64.7 kg)  08/23/24 137 lb 6.4 oz (62.3 kg)  05/19/24 140 lb 6.4 oz (63.7 kg)    BP Readings from Last 3 Encounters:  10/14/24 102/70  08/23/24 102/84  08/08/24 96/68     Physical Exam Vitals and nursing note reviewed. Exam conducted with a chaperone present.  Constitutional:      Appearance: Normal appearance. She is normal weight. She is not toxic-appearing.  HENT:     Head: Normocephalic and atraumatic.     Right Ear: Tympanic membrane, ear canal and external ear normal.     Left Ear: Tympanic membrane, ear canal and external ear normal.     Nose: Nose normal.     Mouth/Throat:     Mouth: Mucous membranes are moist.  Eyes:     Extraocular Movements: Extraocular movements intact.     Conjunctiva/sclera: Conjunctivae normal.     Pupils: Pupils are equal, round, and reactive to light.  Neck:     Thyroid : No thyroid  mass, thyromegaly or thyroid  tenderness.  Cardiovascular:     Rate and Rhythm: Normal rate and regular rhythm.     Pulses: Normal pulses.     Heart sounds: Normal heart sounds.  Pulmonary:     Effort: Pulmonary effort is normal.     Breath sounds: Normal breath sounds.  Chest:     Chest wall: No mass.  Breasts:    Right: Normal. No swelling, bleeding, inverted nipple, mass, nipple discharge, skin change or tenderness.     Left: Normal. No swelling, bleeding, inverted nipple, mass, nipple discharge, skin change or tenderness.  Abdominal:     General: Abdomen is flat. Bowel sounds are normal.     Palpations: Abdomen is soft.  Genitourinary:    General: Normal vulva.     Labia:        Right: No rash, tenderness or lesion.        Left: No rash, tenderness or lesion.      Vagina: Normal.     Cervix: Normal.     Uterus: Normal.      Adnexa: Right adnexa normal and left adnexa normal.  Musculoskeletal:        General: Normal range of motion.     Cervical back: Normal range of motion  and neck supple.     Right lower leg: No edema.     Left lower leg: No edema.  Lymphadenopathy:     Cervical: No cervical adenopathy.     Upper Body:     Right upper body: No supraclavicular, axillary or pectoral adenopathy.     Left upper body: No supraclavicular, axillary or pectoral adenopathy.  Skin:    General: Skin is warm and dry.     Findings: No lesion.  Neurological:     General: No focal deficit present.  Mental Status: She is alert and oriented to person, place, and time.  Psychiatric:        Mood and Affect: Mood normal.        Behavior: Behavior normal.        Thought Content: Thought content normal.        Judgment: Judgment normal.             Lisel Siegrist M Mea Ozga, PA-C      [1]  Social History Tobacco Use   Smoking status: Former    Types: Cigarettes    Passive exposure: Never   Smokeless tobacco: Never  Vaping Use   Vaping status: Never Used  Substance Use Topics   Alcohol use: No   Drug use: No  [2]  Allergies Allergen Reactions   Other Swelling    Tree nuts- pine cone   Pistachio Nut (Diagnostic) Swelling   Shrimp [Shellfish Allergy ] Swelling   "

## 2024-10-14 NOTE — Patient Instructions (Addendum)
-   Talk with your husband about life insurance, have life insurance established BEFORE pursuing any genetic testing -  I would strongly encourage genetic testing after your discussion above - [facility] program online, look into this -  You will be contacted to schedule your mammogram and ultrasound  Keep working on good exercise and nutrition habits.

## 2024-10-20 ENCOUNTER — Ambulatory Visit: Payer: Self-pay | Admitting: Physician Assistant

## 2024-10-20 LAB — CYTOLOGY - PAP
Chlamydia: NEGATIVE
Comment: NEGATIVE
Comment: NEGATIVE
Comment: NEGATIVE
Comment: NORMAL
Diagnosis: NEGATIVE
High risk HPV: NEGATIVE
Neisseria Gonorrhea: NEGATIVE
Trichomonas: NEGATIVE

## 2024-10-24 ENCOUNTER — Other Ambulatory Visit: Payer: Self-pay | Admitting: Physician Assistant

## 2024-11-04 ENCOUNTER — Ambulatory Visit
Admission: RE | Admit: 2024-11-04 | Discharge: 2024-11-04 | Disposition: A | Source: Ambulatory Visit | Attending: Physician Assistant

## 2024-11-04 DIAGNOSIS — N6312 Unspecified lump in the right breast, upper inner quadrant: Secondary | ICD-10-CM

## 2024-11-04 DIAGNOSIS — Z803 Family history of malignant neoplasm of breast: Secondary | ICD-10-CM

## 2024-11-07 ENCOUNTER — Other Ambulatory Visit

## 2024-11-07 ENCOUNTER — Encounter

## 2024-11-08 ENCOUNTER — Other Ambulatory Visit
